# Patient Record
Sex: Female | Born: 1960 | Race: Black or African American | Hispanic: No | Smoking: Former smoker
Health system: Southern US, Community
[De-identification: ages and names within clinical notes are randomized; demographics above are authoritative.]

## PROBLEM LIST (undated history)

## (undated) DIAGNOSIS — K5090 Crohn's disease, unspecified, without complications: Secondary | ICD-10-CM

## (undated) DIAGNOSIS — I10 Essential (primary) hypertension: Secondary | ICD-10-CM

---

## 2004-12-10 ENCOUNTER — Ambulatory Visit (HOSPITAL_COMMUNITY): Admission: RE | Admit: 2004-12-10 | Discharge: 2004-12-10 | Payer: Self-pay | Admitting: Obstetrics and Gynecology

## 2005-07-31 ENCOUNTER — Emergency Department (HOSPITAL_COMMUNITY): Admission: EM | Admit: 2005-07-31 | Discharge: 2005-08-01 | Payer: Self-pay | Admitting: Emergency Medicine

## 2005-08-01 ENCOUNTER — Encounter (INDEPENDENT_AMBULATORY_CARE_PROVIDER_SITE_OTHER): Payer: Self-pay | Admitting: *Deleted

## 2006-04-01 ENCOUNTER — Ambulatory Visit: Payer: Self-pay | Admitting: Family Medicine

## 2006-04-06 ENCOUNTER — Ambulatory Visit: Payer: Self-pay | Admitting: Sports Medicine

## 2006-04-08 ENCOUNTER — Ambulatory Visit: Payer: Self-pay | Admitting: Family Medicine

## 2006-04-21 ENCOUNTER — Ambulatory Visit (HOSPITAL_COMMUNITY): Admission: RE | Admit: 2006-04-21 | Discharge: 2006-04-21 | Payer: Self-pay | Admitting: Obstetrics and Gynecology

## 2006-05-04 ENCOUNTER — Encounter: Admission: RE | Admit: 2006-05-04 | Discharge: 2006-05-04 | Payer: Self-pay | Admitting: Obstetrics and Gynecology

## 2007-05-16 IMAGING — MG MM DIAGNOSTIC LTD RIGHT
3 series · 3 of 3 positions shown · non-contrast
Comparison: none

[REDACTED] RIGHT
CC and MLO view(s) were taken of the right breast.

RIGHT BREAST ULTRASOUND
DIGITAL LIMITED RIGHT DIAGNOSTIC MAMMOGRAM AND RIGHT BREAST ULTRASOUND:
CLINICAL DATA: Abnormal screening mammogram.

[R CC]
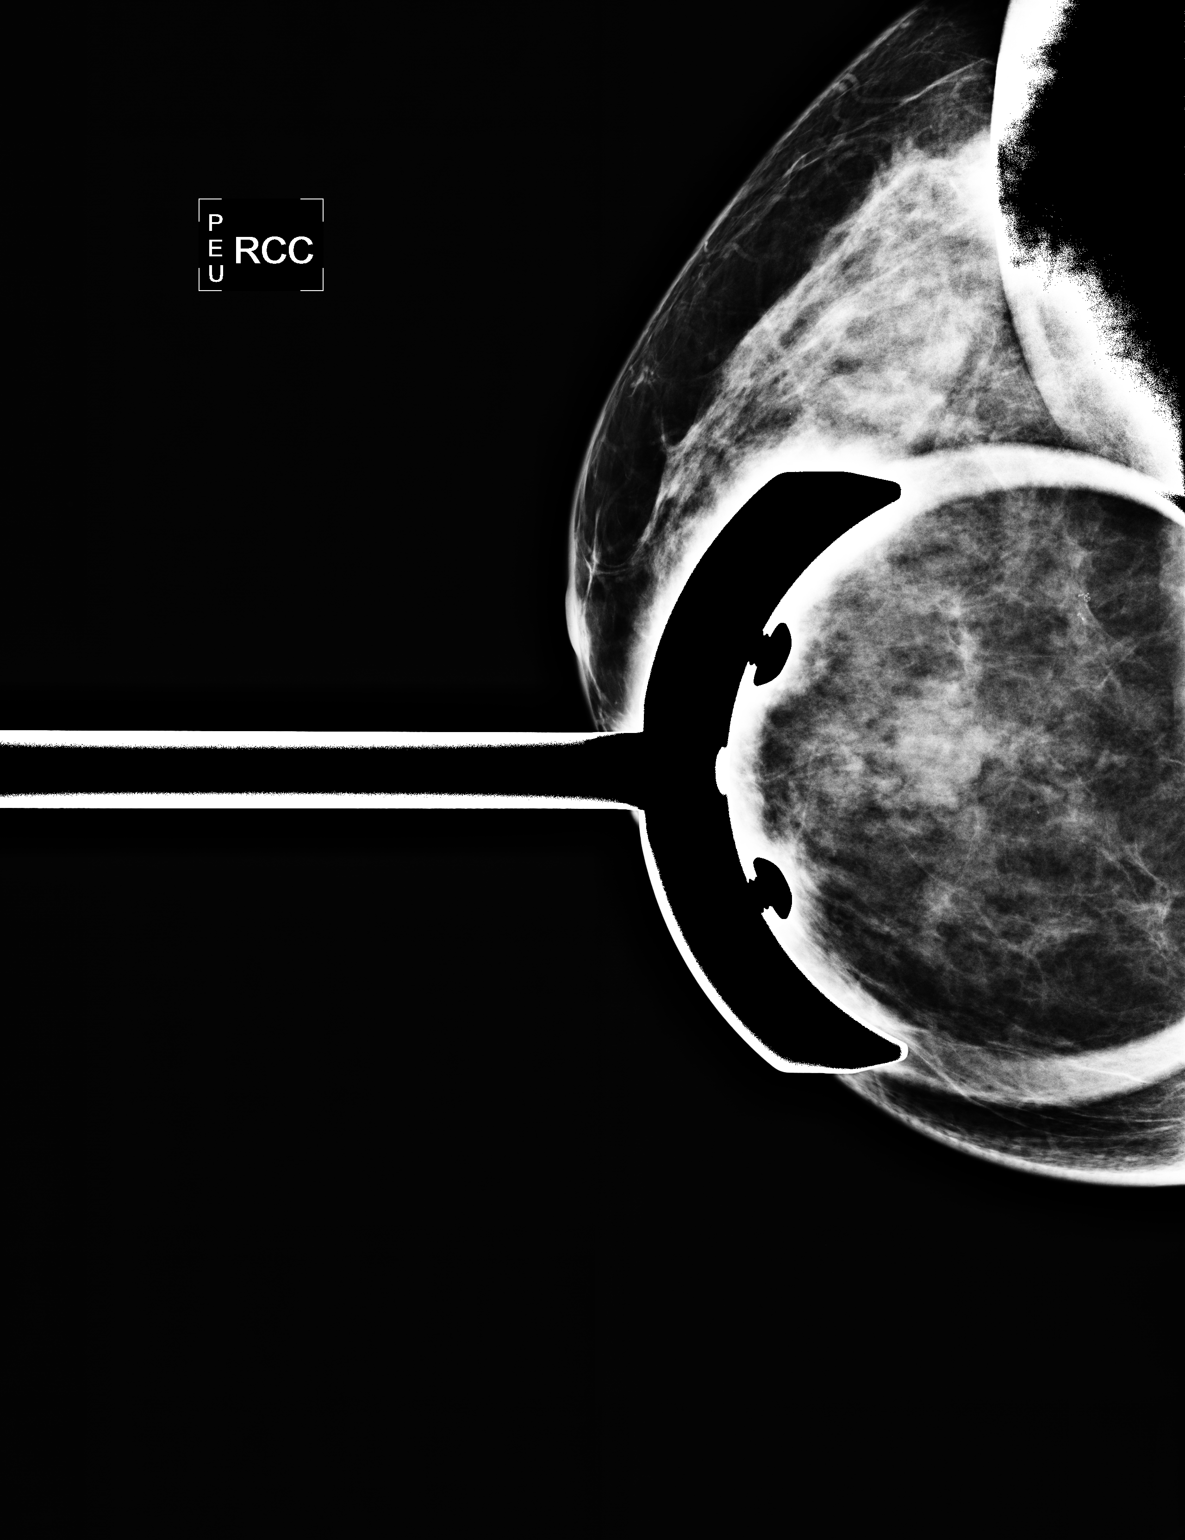

[R MLO (1 of 2)]
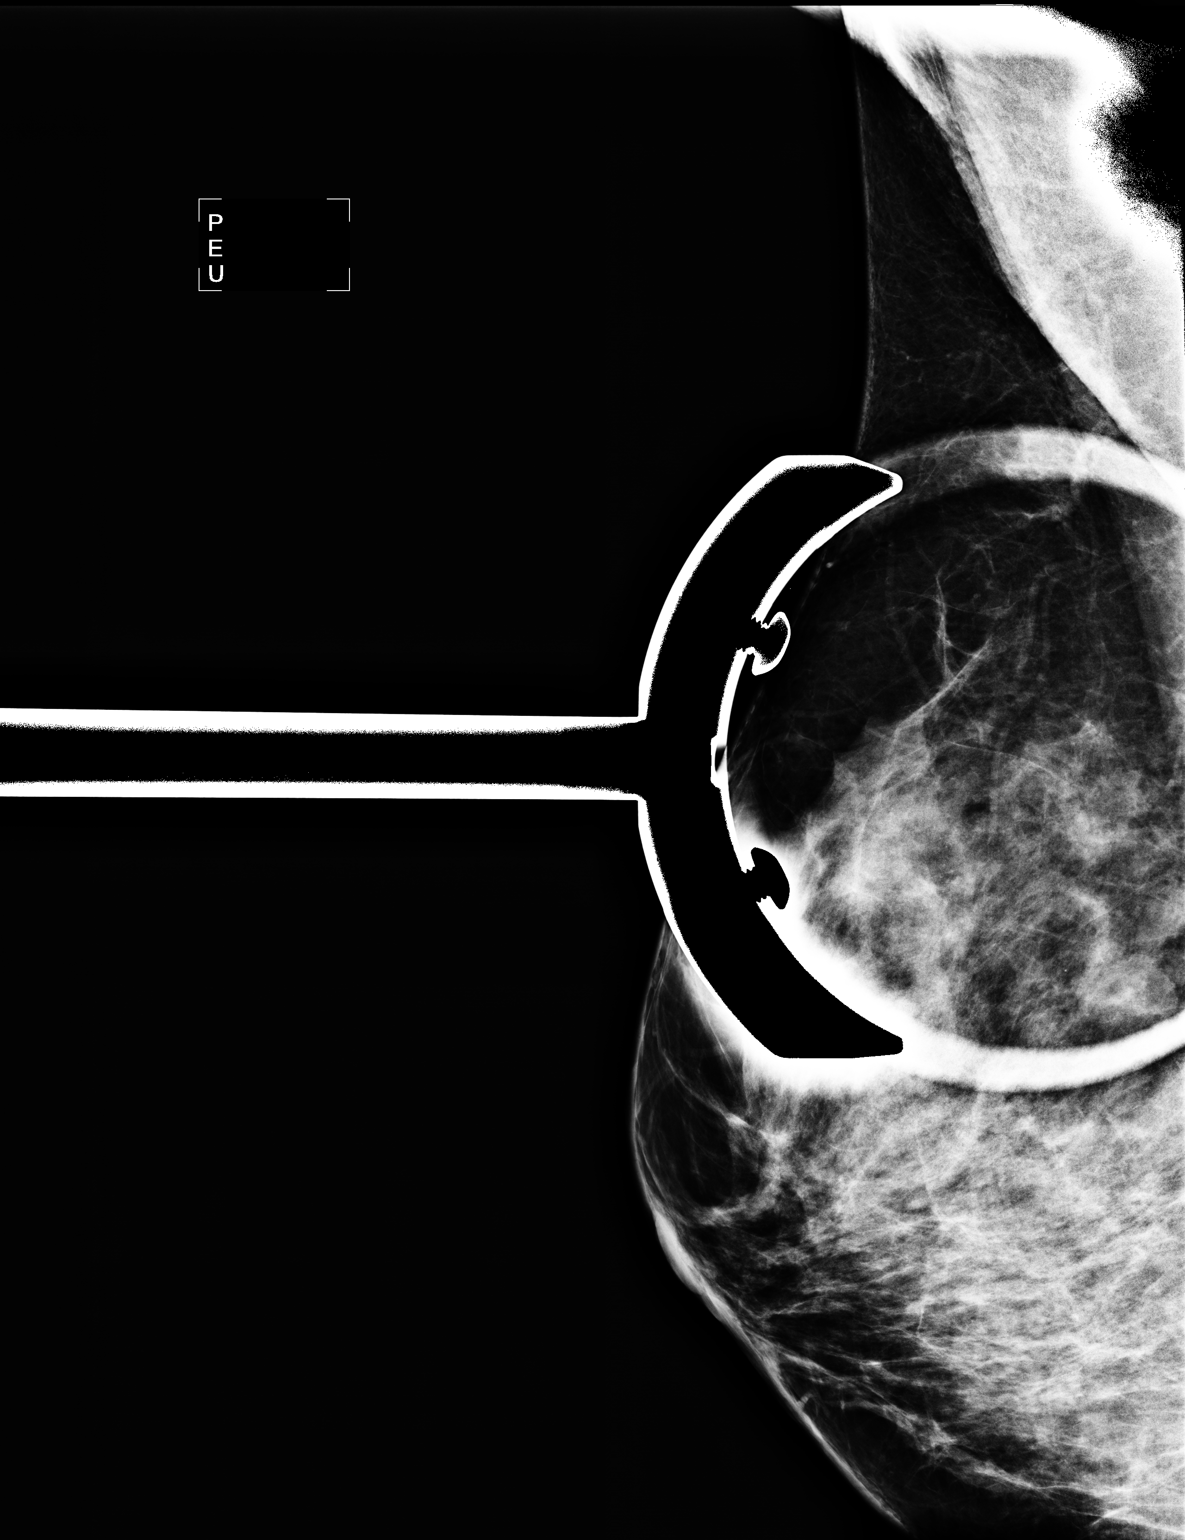

[R MLO (2 of 2)]
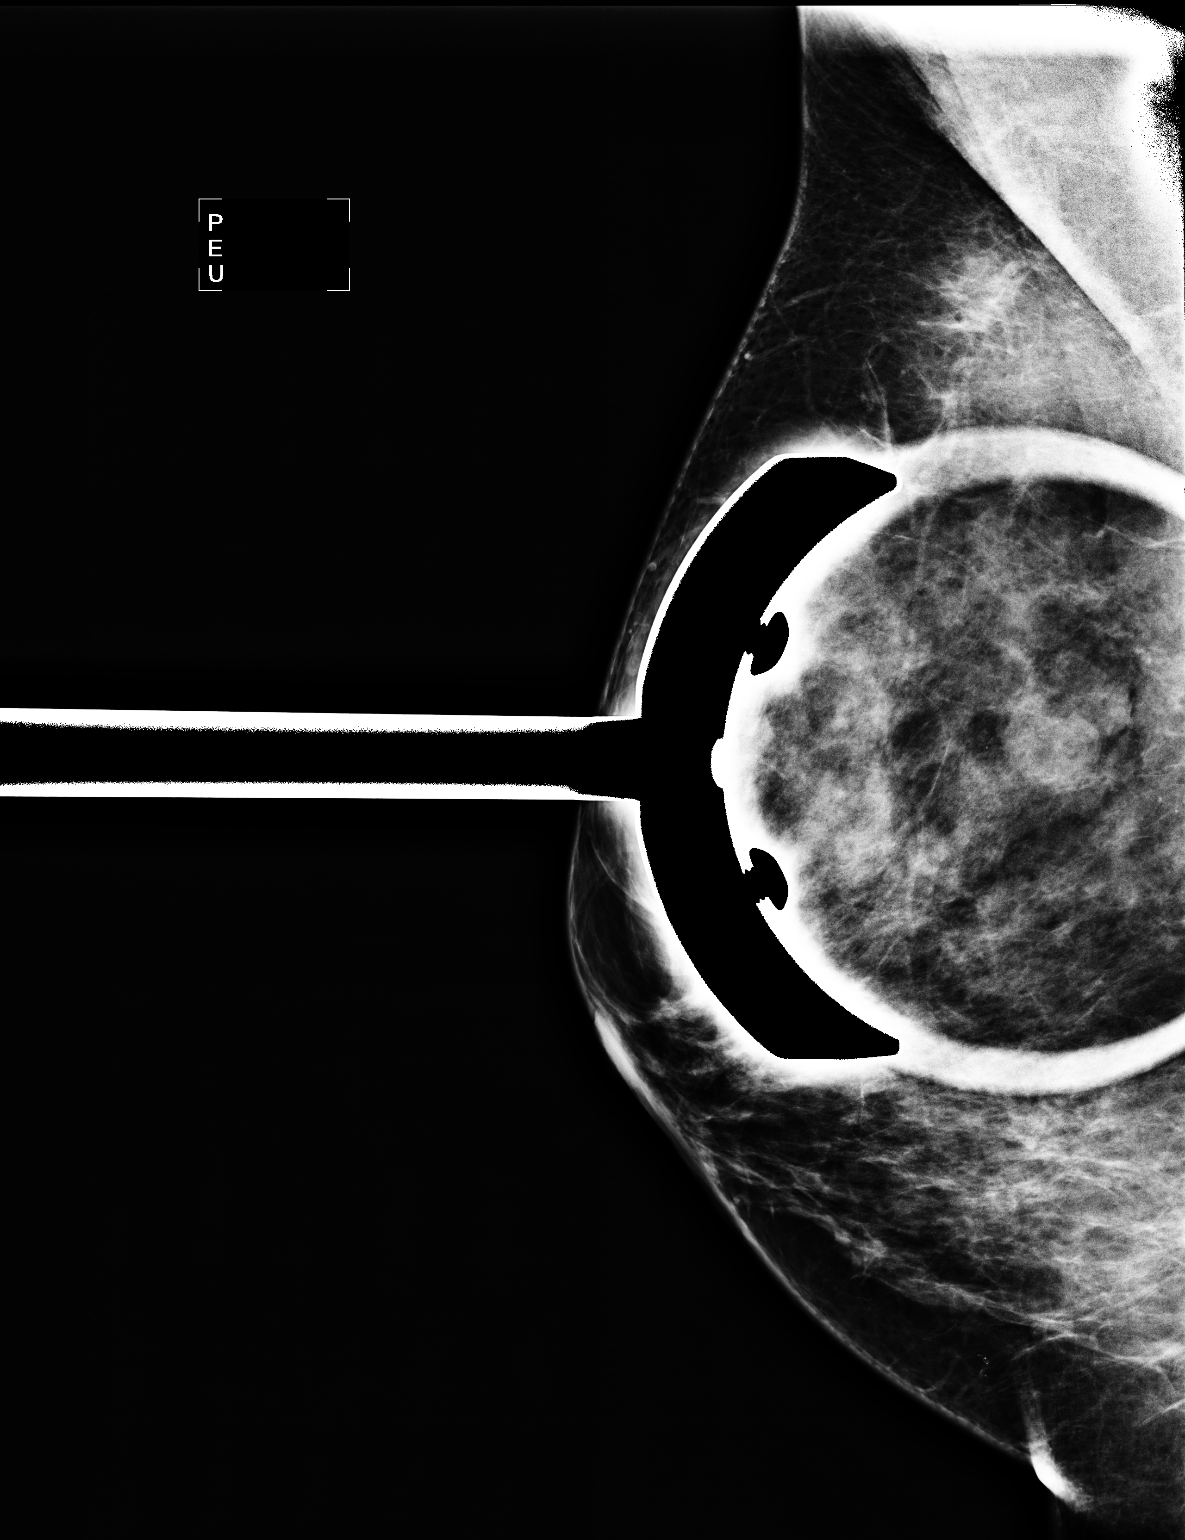

[3 of 3 positions shown; findings below may reference images not displayed]

Spot compression views reveal persistence of a well-defined nodule in the right upper inner 
quadrant.  No suspicious calcifications or distortion are present.

Physical exam of the right breast is normal.  Ultrasound reveals a simple cyst at the 1 o'clock 
position, 4 cm from the nipple which measures 1.2 cm.  No suspicious solid masses or shadowing are 
seen.
IMPRESSION: Simple cyst in the right upper inner quadrant.  There is no specific radiographic evidence of 
malignancy on the right.  Screening mammogram in one year is recommended.

ASSESSMENT: Benign - BI-RADS 2

Screening mammogram of both breasts in 1 year.
,

## 2007-06-06 ENCOUNTER — Encounter: Admission: RE | Admit: 2007-06-06 | Discharge: 2007-06-06 | Payer: Self-pay | Admitting: Obstetrics and Gynecology

## 2008-06-14 ENCOUNTER — Encounter: Admission: RE | Admit: 2008-06-14 | Discharge: 2008-06-14 | Payer: Self-pay | Admitting: Obstetrics and Gynecology

## 2008-06-26 ENCOUNTER — Encounter: Admission: RE | Admit: 2008-06-26 | Discharge: 2008-06-26 | Payer: Self-pay | Admitting: Obstetrics and Gynecology

## 2009-06-17 ENCOUNTER — Encounter: Admission: RE | Admit: 2009-06-17 | Discharge: 2009-06-17 | Payer: Self-pay | Admitting: Obstetrics and Gynecology

## 2009-06-25 ENCOUNTER — Emergency Department (HOSPITAL_COMMUNITY): Admission: EM | Admit: 2009-06-25 | Discharge: 2009-06-25 | Payer: Self-pay | Admitting: Family Medicine

## 2009-07-08 IMAGING — US UNKNOWN US STUDY
1 series · 9 of 9 positions shown · non-contrast
Comparison: Prior studies

CLINICAL DATA: Abnormal screening mammogram

DIGITAL DIAGNOSTIC  LEFT BREAST  MAMMOGRAM   AND LEFT BREAST
ULTRASOUND:

[Series 1: unknown us study · 9 of 9 slices shown]
[im 1/9]
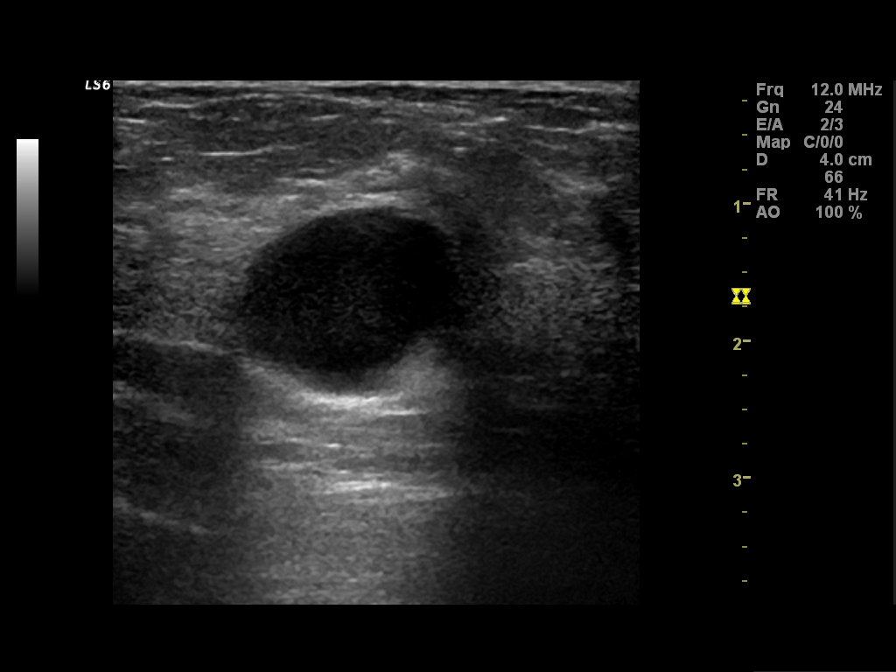
[im 2/9]
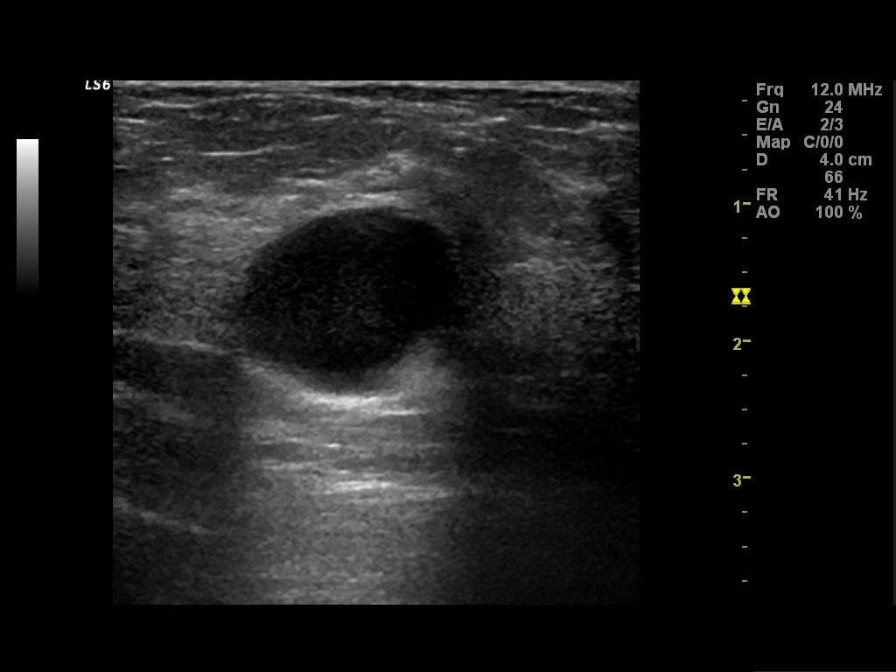
[im 3/9]
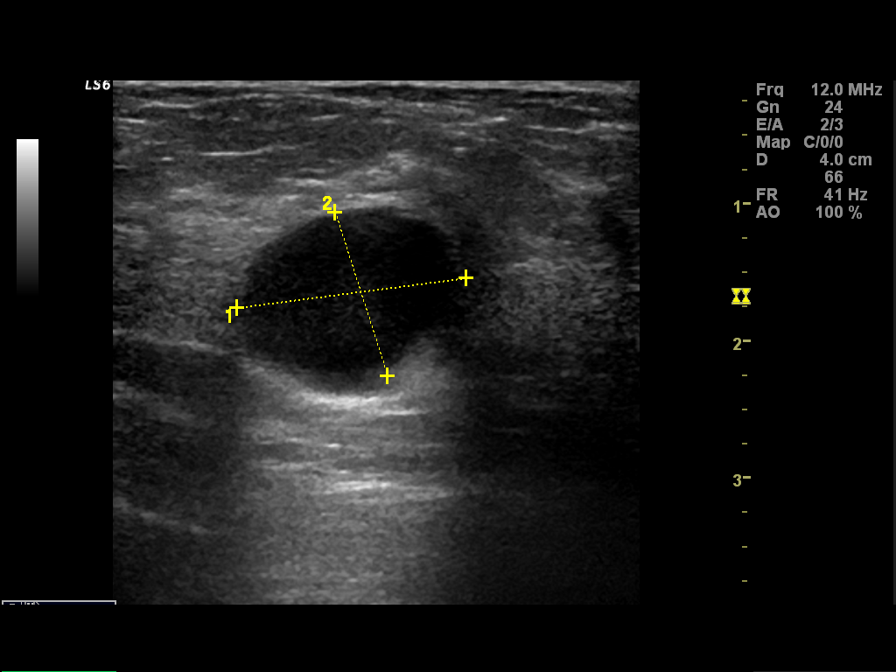
[im 4/9]
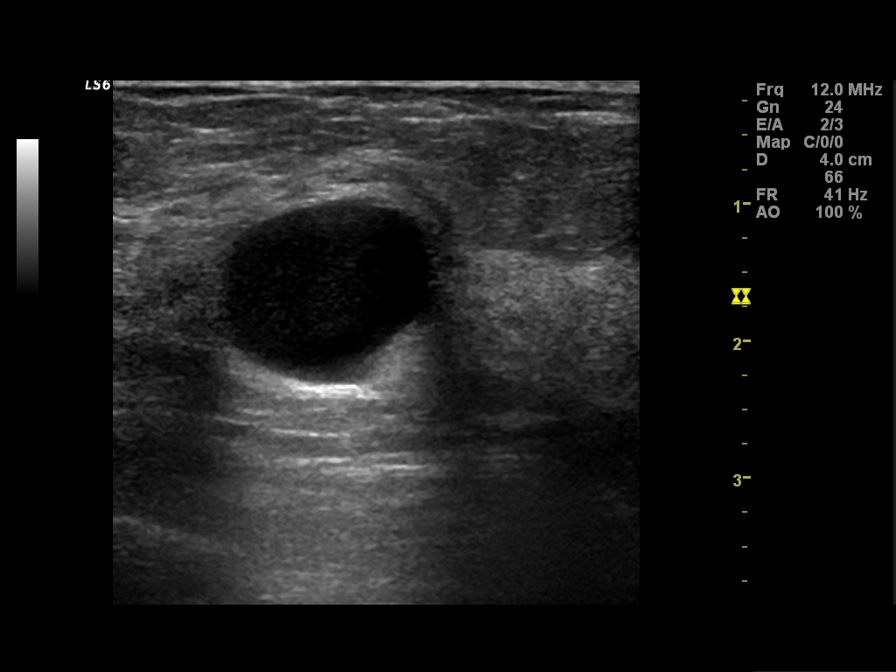
[im 5/9]
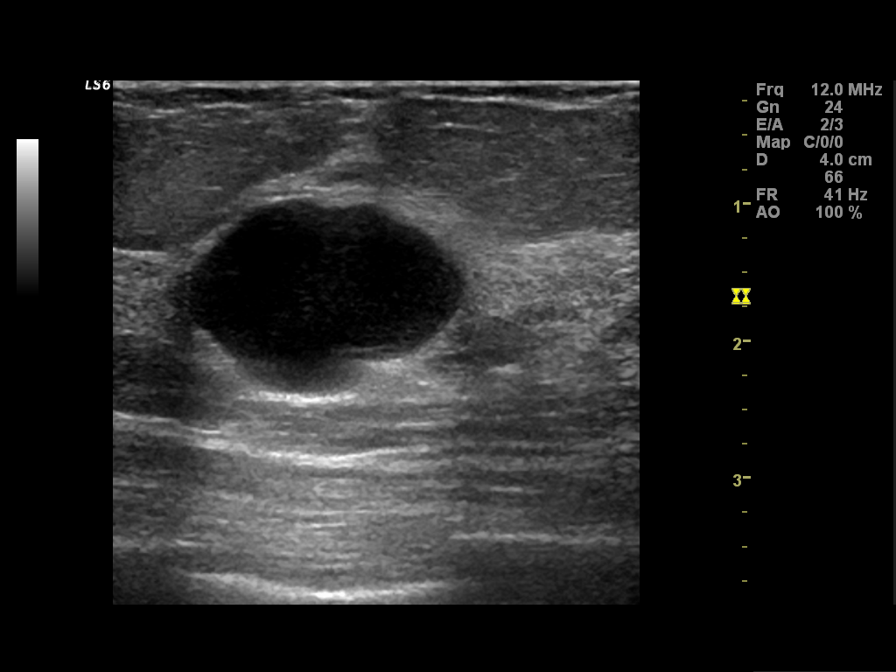
[im 6/9]
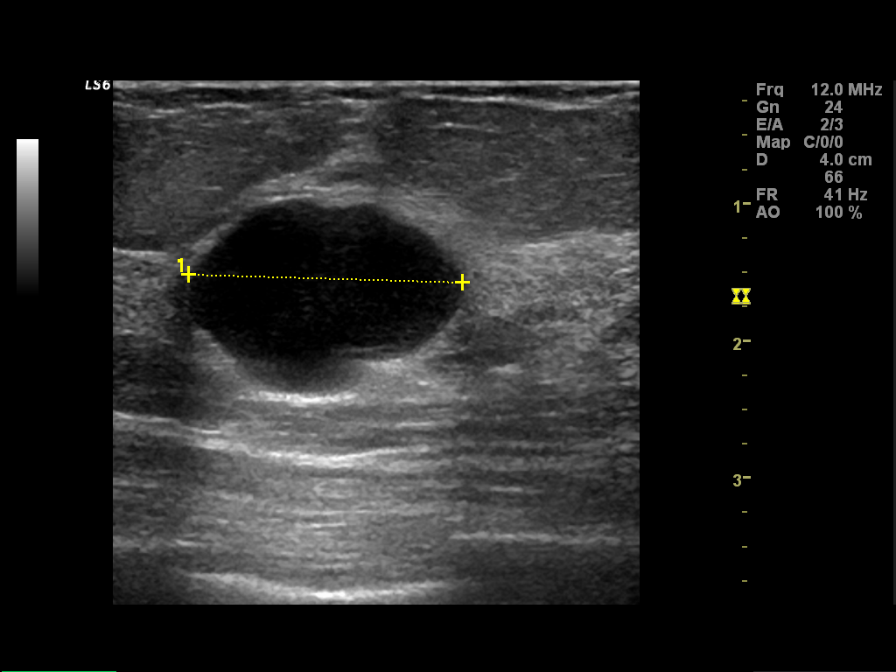
[im 7/9]
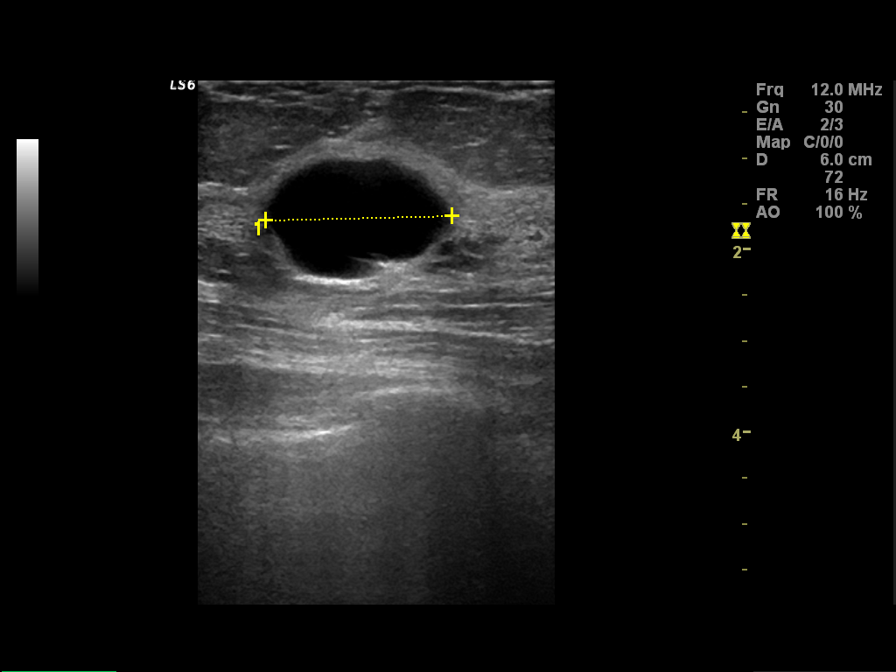
[im 8/9]
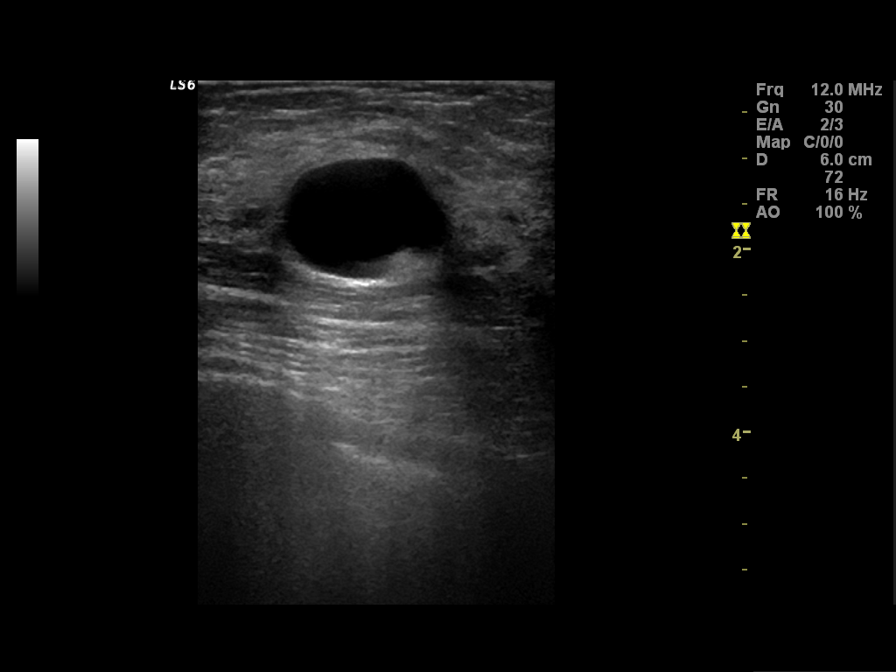
[im 9/9]
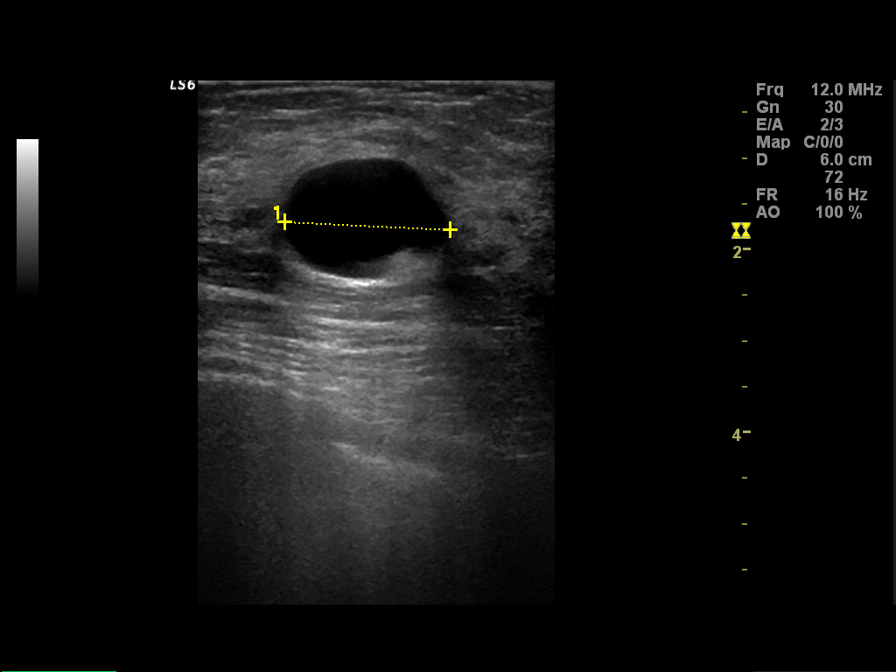

[9 of 9 positions shown; findings below may reference images not displayed]

FINDINGS: Focally compressed left CC and left MLO views were
obtained.  These demonstrate a partially circumscribed nodule
located at the 12 o'clock position within the left breast.

On physical exam, there is a small mobile palpable nodule located
superiorly within the left breast at the 12 o'clock position
(approximately 5 cm from the nipple).

Ultrasound is performed, showing a simple cyst located within the
left breast at the 12 o'clock position 5 cm from the nipple.  This
measures maximally 2 cm in diameter and corresponds to the nodular
density seen in this region on mammography.  There is no solid
mass, distortion, or worrisome shadowing.
IMPRESSION: 1.  2 cm in diameter simple cyst located at the 12 o'clock position
within the left breast 5 cm from the nipple corresponding to the
nodule seen in this region on mammography.  No findings worrisome
for malignancy.  Recommend return to screening mammography in 1
year.

BI-RADS CATEGORY 2:  Benign finding(s).

## 2010-04-15 ENCOUNTER — Inpatient Hospital Stay (HOSPITAL_COMMUNITY): Admission: AD | Admit: 2010-04-15 | Discharge: 2010-04-19 | Payer: Self-pay | Admitting: Internal Medicine

## 2010-04-15 ENCOUNTER — Encounter (INDEPENDENT_AMBULATORY_CARE_PROVIDER_SITE_OTHER): Payer: Self-pay | Admitting: *Deleted

## 2010-04-18 ENCOUNTER — Encounter (INDEPENDENT_AMBULATORY_CARE_PROVIDER_SITE_OTHER): Payer: Self-pay | Admitting: *Deleted

## 2010-04-19 ENCOUNTER — Encounter (INDEPENDENT_AMBULATORY_CARE_PROVIDER_SITE_OTHER): Payer: Self-pay | Admitting: *Deleted

## 2010-04-22 ENCOUNTER — Telehealth: Payer: Self-pay | Admitting: Internal Medicine

## 2010-04-24 DIAGNOSIS — Z862 Personal history of diseases of the blood and blood-forming organs and certain disorders involving the immune mechanism: Secondary | ICD-10-CM

## 2010-04-24 DIAGNOSIS — Z8639 Personal history of other endocrine, nutritional and metabolic disease: Secondary | ICD-10-CM

## 2010-04-24 DIAGNOSIS — K5289 Other specified noninfective gastroenteritis and colitis: Secondary | ICD-10-CM

## 2010-04-24 DIAGNOSIS — E669 Obesity, unspecified: Secondary | ICD-10-CM

## 2010-04-24 DIAGNOSIS — I10 Essential (primary) hypertension: Secondary | ICD-10-CM | POA: Insufficient documentation

## 2010-04-24 DIAGNOSIS — A048 Other specified bacterial intestinal infections: Secondary | ICD-10-CM | POA: Insufficient documentation

## 2010-04-24 DIAGNOSIS — E8881 Metabolic syndrome: Secondary | ICD-10-CM

## 2010-04-24 DIAGNOSIS — E119 Type 2 diabetes mellitus without complications: Secondary | ICD-10-CM

## 2010-04-25 ENCOUNTER — Ambulatory Visit: Payer: Self-pay | Admitting: Internal Medicine

## 2010-05-05 ENCOUNTER — Telehealth: Payer: Self-pay | Admitting: Internal Medicine

## 2010-05-09 ENCOUNTER — Emergency Department (HOSPITAL_COMMUNITY): Admission: EM | Admit: 2010-05-09 | Discharge: 2010-05-09 | Payer: Self-pay | Admitting: Family Medicine

## 2010-05-13 ENCOUNTER — Encounter (INDEPENDENT_AMBULATORY_CARE_PROVIDER_SITE_OTHER): Payer: Self-pay | Admitting: *Deleted

## 2010-05-15 ENCOUNTER — Telehealth: Payer: Self-pay | Admitting: Internal Medicine

## 2010-05-15 ENCOUNTER — Ambulatory Visit: Payer: Self-pay | Admitting: Internal Medicine

## 2010-05-23 ENCOUNTER — Ambulatory Visit: Payer: Self-pay | Admitting: Internal Medicine

## 2010-05-26 ENCOUNTER — Encounter: Payer: Self-pay | Admitting: Internal Medicine

## 2010-05-27 ENCOUNTER — Telehealth (INDEPENDENT_AMBULATORY_CARE_PROVIDER_SITE_OTHER): Payer: Self-pay | Admitting: *Deleted

## 2010-08-23 ENCOUNTER — Encounter: Payer: Self-pay | Admitting: Obstetrics and Gynecology

## 2010-08-24 ENCOUNTER — Encounter: Payer: Self-pay | Admitting: Obstetrics and Gynecology

## 2010-09-02 NOTE — Procedures (Signed)
Summary: Upper Endoscopy  Patient: Barbara Drake Note: All result statuses are Final unless otherwise noted.  Tests: (1) Upper Endoscopy (EGD)   EGD Upper Endoscopy       DONE     [facility]     [address]     [place], [place]  [address]           ENDOSCOPY PROCEDURE REPORT           PATIENT:  Barbara Drake, Barbara Drake  MR#:  [id number]     BIRTHDATE:  1960-10-24, 49 yrs. old  GENDER:  female           ENDOSCOPIST:  Hedwig Morton. Juanda Chance, MD     Referred by:  Della Goo, M.D.           PROCEDURE DATE:  05/23/2010     PROCEDURE:  EGD with biopsy     ASA CLASS:  Class II     INDICATIONS:  abdominal pain, multiple sites, diarrhea, abnormal     imaging, anemia CT showed colitis,     positive H.Pylori antibody treated, feels improved           MEDICATIONS:   Versed 5 mg, Fentanyl 50 mcg     TOPICAL ANESTHETIC:  Exactacain Spray           DESCRIPTION OF PROCEDURE:   After the risks benefits and     alternatives of the procedure were thoroughly explained, informed     consent was obtained.  The LB GIF-H180 [id number] endoscope was     introduced through the mouth and advanced to the second portion of     the duodenum, without limitations.  The instrument was slowly     withdrawn as the mucosa was fully examined.     <<PROCEDUREIMAGES>>           The upper, middle, and distal third of the esophagus were     carefully inspected and no abnormalities were noted. The z-line     was well seen at the GEJ. The endoscope was pushed into the fundus     which was normal including a retroflexed view. The antrum,gastric     body, first and second part of the duodenum were unremarkable.     With standard forceps, a biopsy was obtained and sent to pathology.     gastric biopsy to r/o H (see image1, image2, image3, image4,     image5, and image6).Pylori    Retroflexed views revealed no     abnormalities.    The scope was then withdrawn from the patient     and the procedure completed.          COMPLICATIONS:  None           ENDOSCOPIC IMPRESSION:     1) Normal EGD     s/p gastric biopsies for H.Pylori     RECOMMENDATIONS:     1) Await biopsy results     colonoscopy           REPEAT EXAM:  In 0 year(s) for.           ______________________________     Hedwig Morton. Juanda Chance, MD           CC:           n.     eSIGNED:   Hedwig Morton. Brodie at 05/23/2010 08:44 AM           [name], [id number]  Note: An exclamation  mark (!) indicates a result that was not dispersed into the flowsheet. Document Creation Date: 05/23/2010 8:44 AM _______________________________________________________________________  (1) Order result status: Final Collection or observation date-time: 05/23/2010 08:15 Requested date-time:  Receipt date-time:  Reported date-time:  Referring Physician:   Ordering Physician: Lina Sar [phone]) Specimen Source:  Source: Launa [employer] Order Number: [id number] Lab site:

## 2010-09-02 NOTE — Procedures (Signed)
Summary: Colonoscopy  Patient: [name] Note: All result statuses are Final unless otherwise noted.  Tests: (1) Colonoscopy (COL)   COL Colonoscopy           DONE     [facility]     [address]     [place], [place]  [address]           COLONOSCOPY PROCEDURE REPORT           PATIENT:  Barbara Drake, Barbara Drake  MR#:  [id number]     BIRTHDATE:  1961/01/20, 49 yrs. old  GENDER:  female     ENDOSCOPIST:  Hedwig Morton. Juanda Chance, MD     REF. BY:  Della Goo, M.D.     PROCEDURE DATE:  05/23/2010     PROCEDURE:  Colonoscopy with biopsy     ASA CLASS:  Class II     INDICATIONS:  unexplained diarrhea, hematochezia, Iron deficiency     anemia, Abdominal pain, Abnormal CT of abdomen recent hospital for     dedydration due to severe diarrhea     MEDICATIONS:   Versed 2 mg           DESCRIPTION OF PROCEDURE:   After the risks benefits and     alternatives of the procedure were thoroughly explained, informed     consent was obtained.  Digital rectal exam was performed and     revealed no rectal masses.   The LB PCF-H180AL [id number] endoscope     was introduced through the anus and advanced to the cecum, which     was identified by both the appendix and ileocecal valve, without     limitations.  The quality of the prep was good, using MiraLax.     The instrument was then slowly withdrawn as the colon was fully     examined.     <<PROCEDUREIMAGES>>           FINDINGS:  There were mucosal changes consistent with Crohn's     disease. widespread small and large shallow ulcerations through     the colon with rectosigmoid sparing, consistent with moderately     severe Crohn's disease, ileocecal valve appears normal, unable to     intubate TI Random biopsies were obtained and sent to pathology     (see image1, image2, image3, image4, image5, image6, image7,     image8, image9, image10, image11, and image12). #1 right colon, #2     left colon   Retroflexed views in the rectum revealed  no     abnormalities.    The scope was then withdrawn from the patient     and the procedure completed.           COMPLICATIONS:  None     ENDOSCOPIC IMPRESSION:     1) Colitis - Crohn's     moderately severe, s/p random biopsies,,relative rectosigmoid     sparing, TI not intubated     RECOMMENDATIONS:     1) Await biopsy results     Asacal HD 800mg  2 po tid     OV 3-4 weeks     Bentyl 10 mg po bid     Feso4 325 mg po tid     REPEAT EXAM:  In 5 year(s) for.           ______________________________     Hedwig Morton. Juanda Chance, MD           CC:  n.     eSIGNED:   Hedwig Morton. Brodie at 05/23/2010 08:56 AM           Page 2 of 3   Talia, Hoheisel Dixon, [id number]  Note: An exclamation mark (!) indicates a result that was not dispersed into the flowsheet. Document Creation Date: 05/23/2010 8:56 AM _______________________________________________________________________  (1) Order result status: Final Collection or observation date-time: 05/23/2010 08:35 Requested date-time:  Receipt date-time:  Reported date-time:  Referring Physician:   Ordering Physician: Lina Sar [phone]) Specimen Source:  Source: Launa Grill Order Number: [id number] Lab site:   Appended Document: Colonoscopy     Procedures Next Due Date:    Colonoscopy: 06/2015

## 2010-09-02 NOTE — Discharge Summary (Signed)
Summary: Colitis   NAME:  Barbara Drake, Barbara Drake           ACCOUNT NO.:  [id number]      MEDICAL RECORD NO.:  [id number]          PATIENT TYPE:  INP      LOCATION:  1318                         FACILITY:  [facility]      PHYSICIAN:  Pleas Koch, MD        DATE OF BIRTH:  1960-09-19      DATE OF ADMISSION:  04/15/2010   DATE OF DISCHARGE:                                  DISCHARGE SUMMARY      THIS DOCUMENT HAS NOT BEEN REVIEWED OR ELECTRONICALLY SIGNED BY THE   DICTATOR OR ATTENDING PHYSICIAN      PRIMARY CARE PHYSICIAN:  Della Goo, M.D.      DISCHARGE DIAGNOSES:   1. Unspecified colitis.   2. Hypokalemia.   3. Relative hypotension.   4. Moderately controlled diabetes mellitus type 2.   5. Obesity.   6. Metabolic syndrome.      DISCHARGE MEDICATIONS:  Promethazine 25 mg p.o. q.8 hourly as needed and   20 day supply was given.      BRIEF HOSPITAL HISTORY:  The patient is a 50 year old female seen at Dr.   Ernie Avena office secondary to complaint of nausea, vomiting, diarrhea,   chills, fever, crampy abdominal pain worsening over 5 days.  The patient   reported inability to hold down food and liquid.  The patient states   diffuse abdominal pain 8-9/10.  Denied hematemesis, hematochezia,   melena.  Found to have temperature of 101.5.  Found to also have   tachycardia 107.  The patient does work in a nursing home and the   diagnosis of C. diff colitis was entertained.      PAST MEDICAL HISTORY:  Significant for hypertension.      PAST SURGICAL HISTORY:  Left oophorectomy.      ADMISSION MEDICATIONS:  Amlodipine 2.5 mg once daily.      SOCIAL HISTORY:  The patient is a former smoker, nondrinker, no illicit   drugs.      PHYSICAL EXAMINATION:  VITAL SIGNS:  On exam at the office the patient   was found to have temperature 101.5, blood pressure 112/78, heart rate   from 91-107, respirations 18.   HEENT:  PERRLA, EOMI.  Funduscopy benign.  Nares patent.  Oropharynx   clear.   NECK:  Supple, full range of motion, no thyromegaly.   CARDIOVASCULAR:  Mild tachycardia.  No murmurs, rubs or gallops.   LUNGS:  Clinically clear.   ABDOMEN:  Positive bowel sounds, soft, mildly tender diffusely.  No   rebound, guarding.  No hepatosplenomegaly.   EXTREMITIES:  No cyanosis or clubbing.   NEUROLOGICAL:  Grossly normal.      The patient had blood cultures ordered, stool cultures, C. diff PCR, C.   diff was done and was directly admitted.      HOSPITAL SUMMARY:  According to chart.  The patient was seen on   September 14 and stated that she felt better.  She had CT abdomen that   showed...labs on admission were as follows...pertinent admission labs   were  as follows:  WBC 8, hemoglobin 11.4, hematocrit 34.0, platelets   282, neutrophils 72, lymphocytes 16, eosinophils 1, basophils 0.  CMP   was sodium 137, potassium 3.6, chloride 105, CO2 24, glucose 95, BUN 6,   creatinine 0.82, T bili 0.2, alk phos 58, SGOT 23, SGPT 27, total   protein 7.1, total albumin 3.4, calcium 8.7.  TSH was 1.765, fecal   lactoferrin was positive.      HOSPITAL COURSE:   1. Colitis / nausea, vomiting / gastroenteritis.  Interestingly the       patient did not really show any signs of acute inflammation on CBC.       Her white count was totally normal all throughout her hospital       stay.  Her C. diff toxin was done which was negative as well.  C.       diff PCR which was negative.  Blood cultures were both negative       from September 13 as well as urine culture which was negative as       well.  She did have a positive Helicobacter pylori and I did       consult Dr. Orvan Falconer of infectious disease to determine course of       care.  He recommended no further antibiotics.  The antibiotics the       patient was on are as follows:  The patient was initially placed on       ciprofloxacin on September 15 and received only one dose of that.       The patient was on metronidazole from September 15  and received 3       days of this.  I did place the patient on Pepto-Bismol       prophylactically as well as various medications such as Gas-X and       Bentyl to decompress abdomen as the patient states she had a lot of       gas.  The patient was also placed on Zofran as well as Phenergan       and did complain of occasional bouts of nausea and vomiting as well       as diarrhea.  Interestingly none of these diarrheal stools were       charted or noticed or documented by nursing although the patient       stated the same.  It is unclear at this point as to whether this       was an objective or subjective diarrhea.  She was discontinued off       all antibiotics on day of discharge April 19, 2010, as she was       afebrile.  She did have a temperature occasionally on September 14       night and September 13, however, was afebrile on September 16 and       September 17.  Has discharged home after GI had seen and stated       that they would follow her up as well.   2. Diabetes mellitus.  Diabetes mellitus was moderately controlled       while in hospital.  Her sugars ranged from low 100s to a high of       160 on discharge when she was eating a complete meal.  This will       need to be followed as an outpatient.  The patient may benefit from  low-dose metformin for metabolic syndrome.   3. Relative hypotension.  The patient was only on 2.5 of Norvasc and I       have held this during this hospitalization.  We will let PCP       determine if there is a further need for this.   4. Obesity.  The patient will need secondary prevention measures   5. Hypokalemia.  The patient was hypokalemic on one episode during       hospitalization on September 15 and given oral potassium       supplementation which resolved it.      DISCHARGE EXAMINATION:  Were as follows:   VITAL SIGNS:  97.9, pulse 79, respirations 18, blood pressure 98/63, 96%   on room air.  CBGs were 160 and 106.    GENERAL:  The patient was alert, happy.   HEENT:  Mucosa moist.   CHEST:  Chest was clear.   HEART:  S1, S2, normal murmurs, rubs or gallops.   ABDOMEN:  Soft, nontender, nondistended, no mass.  Bowel sounds present.   NEUROLOGICAL:  Cranial nerves II-XII were intact.      DISCHARGE LABS:  Sodium 137, potassium 3.9, chloride 106, bicarb 25, BUN   4, creatinine 0.72, hemoglobin 9.6, hematocrit 28.5, WBC 7.2, platelets   282, Helicobacter pylori IgG positive at 1.2.      FOLLOWUP:  The patient is to follow up with Dr. Ewing Schlein and Dr. Lovell Sheehan as   per my dictation; 3-5 days with Dr. Lovell Sheehan and 1-2 weeks with Dr.   Ewing Schlein, to reassess the need for possible colonoscopy.      PERTINENT RADIOLOGICAL FINDINGS:  The patient had a CT abdomen on   September 13 which showed mucosal edema of the right colon, transverse   colon to the mid descending colon consistent with colitis.  Appears to   be mucosal edema of the terminal ileum suggesting positive inflammatory   bowel disease such as Crohn's disease, no abscess seen, slightly   prominent common bile duct correlate with LFTs.                  ______________________________   Pleas Koch, MD            THIS DOCUMENT HAS NOT BEEN REVIEWED OR ELECTRONICALLY SIGNED BY THE   DICTATOR OR ATTENDING PHYSICIAN      JS/MEDQ  D:  04/19/2010  T:  04/19/2010  Job:  [id number]      cc:   Petra Kuba, M.D.   Fax: [phone]      Della Goo, M.D.   Fax: [phone]

## 2010-09-02 NOTE — Assessment & Plan Note (Signed)
Summary: post hospital colitis/dn   History of Present Illness Visit Type: consult  Primary GI MD: Lina Sar MD Primary Provider: Ron Parker, MD  Requesting Provider: Ron Parker, MD  Chief Complaint: [facility] f/u for colitis. Pt states that she is on her way to feeling better but still having loose stools  History of Present Illness:   This is a 50 year old African American female who recently hospitalized with acute colitis which started with nausea vomiting, diarrhea, abdominal pain and fever. Her symptoms started several weeks after she begun  Atkins diet, which consists of low carbohydrate meals. Up to that point, she was having regular bowel movements once or twice a day. She started having abdominal distention and malodorous stools at frequent intervals through the day and also through the night. She was having accidents. There was no blood in her stool. She was hospitalized between September 13-17.with severe diarrhea, N&V, dehydration.  Her  stool studies were negative except for fecal lactoferrin which was positive. She had mild anemia with a hemoglobin of 11.4 and a hematocrit of 34. Her albumin was 3.4. A CT scan showed diffuse edema of the terminal ileum, right colon and transverse colon with a normal descending colon. She had a prominent common bile duct. A C. difficile toxin by PCR was negative. She was seen in consultation by Dr Ewing Schlein and chose to have an outpatient colonoscopy  instead of an inpatient colonoscopy. There is no family history of inflammatory bowel disease. She was started on Prevpak  by Dr Lovell Sheehan in the setting of positive H.Pylori antibody.. She has noticed marked improvement of her symptoms on Amoxacillin and Bioxin.  Her stools are softer and she has less pain and less abdominal distention. There is no longer   fever.   GI Review of Systems      Denies abdominal pain, acid reflux, belching, bloating, chest pain, dysphagia with liquids, dysphagia with  solids, heartburn, loss of appetite, nausea, vomiting, vomiting blood, weight loss, and  weight gain.        Denies anal fissure, black tarry stools, change in bowel habit, constipation, diarrhea, diverticulosis, fecal incontinence, heme positive stool, hemorrhoids, irritable bowel syndrome, jaundice, light color stool, liver problems, rectal bleeding, and  rectal pain.    Current Medications (verified): 1)  Prevpac  Misc (Amoxicill-Clarithro-Lansopraz) .... Four in The Morning and Four At Bedtime X 14 Days  Allergies (verified): No Known Drug Allergies  Past History:  Past Medical History: Reviewed history from 04/24/2010 and no changes required. 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 Left oophorectomy  Family History: Reviewed history from 04/24/2010 and no changes required. No FH of Colon Cancer:  Social History: Reviewed history from 04/24/2010 and no changes required. Patient is a former smoker.  Alcohol Use - no Illicit Drug Use - no  Review of Systems       The patient complains of fatigue.  The patient denies allergy/sinus, anemia, anxiety-new, arthritis/joint pain, back pain, blood in urine, breast changes/lumps, change in vision, confusion, cough, coughing up blood, depression-new, fainting, fever, headaches-new, hearing problems, heart murmur, heart rhythm changes, itching, menstrual pain, muscle pains/cramps, night sweats, nosebleeds, pregnancy symptoms, shortness of breath, skin rash, sleeping problems, sore throat, swelling of feet/legs, swollen lymph glands, thirst - excessive , urination - excessive , urination  changes/pain, urine leakage, vision changes, and voice change.          Pertinent positive and negative review of systems were noted in the above HPI. All other ROS was otherwise negative.   Vital Signs:  Patient profile:   50 year old female Height:      62 inches Weight:      158 pounds BMI:     29.00 BSA:     1.73 Pulse rate:   72 / minute Pulse rhythm:   regular BP sitting:   124 / 68  (left arm) Cuff size:   regular  Vitals Entered By: Ok Anis CMA (April 25, 2010 11:04 AM)  Physical Exam  General:  Well developed, well nourished, no acute distress. Eyes:  PERRLA, no icterus. Mouth:  No deformity or lesions, dentition normal. Neck:  Supple; no masses or thyromegaly. Lungs:  Clear throughout to auscultation. Heart:  Regular rate and rhythm; no murmurs, rubs,  or bruits. Abdomen:  mildly protuberant and very tender throughout mostly the right lower quadrant, right upper quadrant and across the upper abdomen. The left lower middle and upper quadrants are unremarkable. There is no rebound. There is no palpable mass or fluid wave.. Rectal:  soft Hemoccult-negative stool.. Extremities:  No clubbing, cyanosis, edema or deformities noted. Skin:  Intact without significant lesions or rashes. Psych:  Alert and cooperative. Normal mood and affect.   Impression & Recommendations:  Problem # 1:  COLITIS (ICD-558.9) Patient has acute colitis which is gradually getting better, unknown etiology. The rapid onset suggest an infectious or toxic etiology. All stool studies have been negative. She is slowly improving. Ischemic colitis would be less likely. Crohn's disease and ulcerative colitis are considerations although I feel that we are dealing with an acute self-limited colitis. She is still very tender and it could be very traumatic to her colon  to proceed with a colonoscopy at this point. She is doing better and the colonoscopy prep could set her back. I prefer for her to continue to take Prevpak since it contains broad-spectrum antibiotics which cover  enteric bacteria. I have explained to her a low fiber diet. She will be on a part-time work schedule this week and will call me next Friday to update her status. We will try to do a colonoscopy when her abdominal tenderness has decreased and when the risk of perforation would be significantly reduced.  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  Patient Instructions: 1)  continue Prevpac. 2)  Low residue diet explained, information booklet given. 3)  Call us in one week with status update. 4)  Plan colonoscopy in the next several weeks after abdominal tenderness has decreased at which time the prep would be more feasible. 5)  Copy sent to : Dr H.Jenkins 6)  The medication list was reviewed and reconciled.  All changed / newly prescribed medications were explained.  A complete medication list was provided to the patient / caregiver.

## 2010-09-02 NOTE — Miscellaneous (Signed)
Summary: [facility] Previsit/prep  Clinical Lists Changes  Medications: Added new medication of DULCOLAX 5 MG  TBEC (BISACODYL) Day before procedure take 2 at 3pm and 2 at 8pm. - Signed Added new medication of METOCLOPRAMIDE HCL 10 MG  TABS (METOCLOPRAMIDE HCL) As per prep instructions. - Signed Added new medication of MIRALAX   POWD (POLYETHYLENE GLYCOL 3350) As per prep  instructions. - Signed Rx of DULCOLAX 5 MG  TBEC (BISACODYL) Day before procedure take 2 at 3pm and 2 at 8pm.;  #4 x 0;  Signed;  Entered by: Wyona Almas RN;  Authorized by: Hart Carwin MD;  Method used: Electronically to [facility]  [phone]*, [address] [place], [place], [place]  [address], Ph: [phone] or [phone], Fax: [phone] Rx of METOCLOPRAMIDE HCL 10 MG  TABS (METOCLOPRAMIDE HCL) As per prep instructions.;  #2 x 0;  Signed;  Entered by: Wyona Almas RN;  Authorized by: Hart Carwin MD;  Method used: Electronically to [facility]  [phone]*, [address] [place], [place], [place]  [address], Ph: [phone] or [phone], Fax: [phone] Rx of MIRALAX   POWD (POLYETHYLENE GLYCOL 3350) As per prep  instructions.;  #255gm x 0;  Signed;  Entered by: Wyona Almas RN;  Authorized by: Hart Carwin MD;  Method used: Electronically to [facility]  [phone]*, [address] [place], [place], [place]  [address], Ph: [phone] or [phone], Fax: [phone] Allergies: Added new allergy or adverse reaction of SULFA Observations: Added new observation of NKA: F (05/15/2010 11:09)    Prescriptions: MIRALAX   POWD (POLYETHYLENE GLYCOL 3350) As per prep  instructions.  #255gm x 0   Entered by:   Wyona Almas RN   Authorized by:   Hart Carwin MD   Signed by:   Wyona Almas RN on 05/15/2010   Method used:   Electronically to        [facility]  [phone]* (retail)       [address]       [place], [place]  [address]       Ph: [phone] or [phone]       Fax: [phone]   RxID:   [id number] METOCLOPRAMIDE HCL 10 MG  TABS (METOCLOPRAMIDE HCL) As per prep instructions.  #2 x 0   Entered by:   Wyona Almas RN   Authorized by:   Hart Carwin MD   Signed by:   Wyona Almas RN on 05/15/2010   Method used:   Electronically to        [facility]  [phone]* (retail)       [address]       [place], [place]  [address]       Ph: [phone] or [phone]       Fax: [phone]   RxID:   [id number] DULCOLAX 5 MG  TBEC (BISACODYL) Day before procedure take 2 at 3pm and 2 at 8pm.  #4 x 0   Entered by:   Wyona Almas RN   Authorized by:   Hart Carwin MD   Signed by:   Wyona Almas RN on 05/15/2010   Method used:   Electronically to        [facility]  [phone]* (retail)       [address] [place]  [place], [place]  [address]       Ph: [phone] or [phone]       Fax: [phone]   RxID:   [id number]

## 2010-09-02 NOTE — Miscellaneous (Signed)
Summary: Asacol, Bentyl, FeSo4 Rx  Clinical Lists Changes  Medications: Added new medication of ASACOL HD 800 MG TBEC (MESALAMINE) Take 2 tablets by mouth three times a day - Signed Added new medication of BENTYL 10 MG  CAPS (DICYCLOMINE HCL) Take 1 by mouth two times a day - Signed Added new medication of FERROUS SULFATE 325 (65 FE) MG  TABS (FERROUS SULFATE) Take 1 by mouth three times a day - Signed Rx of ASACOL HD 800 MG TBEC (MESALAMINE) Take 2 tablets by mouth three times a day;  #180 x 6;  Signed;  Entered by: Jennye Boroughs RN;  Authorized by: Hart Carwin MD;  Method used: Electronically to [facility]  [phone]*, [address], [place], [place], [place]  [address], Ph: [phone] or [phone], Fax: [phone] Rx of BENTYL 10 MG  CAPS (DICYCLOMINE HCL) Take 1 by mouth two times a day;  #60 x 1;  Signed;  Entered by: Jennye Boroughs RN;  Authorized by: Hart Carwin MD;  Method used: Electronically to [facility]  [phone]*, [address] [place], [place], [place]  [address], Ph: [phone] or [phone], Fax: [phone] Rx of FERROUS SULFATE 325 (65 FE) MG  TABS (FERROUS SULFATE) Take 1 by mouth three times a day;  #100 x 0;  Signed;  Entered by: Jennye Boroughs RN;  Authorized by: Hart Carwin MD;  Method used: Electronically to [facility]  [phone]*, [address], [place], [place], [place]  [address], Ph: [phone] or [phone], Fax: [phone]    Prescriptions: FERROUS SULFATE 325 (65 FE) MG  TABS (FERROUS SULFATE) Take 1 by mouth three times a day  #100 x 0   Entered by:   Jennye Boroughs RN   Authorized by:   Hart Carwin MD   Signed by:   Jennye Boroughs RN on 05/23/2010   Method used:   Electronically to        [facility]  [phone]* (retail)       [address]       [place], [place]  [address]       Ph: [phone] or [phone]       Fax: [phone]   RxID:   [id number] BENTYL 10 MG  CAPS (DICYCLOMINE HCL) Take 1 by mouth two times a day  #60 x 1   Entered by:   Jennye Boroughs RN   Authorized by:   Hart Carwin MD   Signed by:   Jennye Boroughs RN on 05/23/2010   Method used:   Electronically to        [facility]  [phone]* (retail)       [address]       [place], [place]  [address]       Ph: [phone] or [phone]       Fax: [phone]   RxID:   [id number] ASACOL HD 800 MG TBEC (MESALAMINE) Take 2 tablets by mouth three times a day  #180 x 6   Entered by:   Jennye Boroughs RN   Authorized by:   Hart Carwin MD   Signed by:   Jennye Boroughs RN on 05/23/2010   Method used:   Electronically to        [facility]  [phone]* (retail)       [address]  [place], [place]  [address]       Ph: [phone] or [phone]       Fax: [phone]   RxID:   [id number]

## 2010-09-02 NOTE — Progress Notes (Signed)
----   Converted from flag ---- ---- 05/26/2010 9:56 PM, Hart Carwin MD wrote: Barbara Drake, please call this lady and start her on prednisone 10mg ,  #100, take 3 by mouth q am, 1 refill, She ougt to see me within next 2-3 weeks. ------------------------------  Phone Note Outgoing Call Call back at [facility] Phone [phone]   Call placed by: Jesse Fall RN,  May 27, 2010 7:56 AM  Follow-up for Phone Call        Message left for patient to call back at her home number. No answer at work number.  Message left for patient to call back again @ 3:00PM Left message for patient to call back at her work #.  Spoke with patient. She will start her Prednisone 30 mg daily. Scheduled appointment with Dr. Juanda Chance on 06/09/10 @ 2:30 PM.  Follow-up by: Jesse Fall RN,  May 28, 2010 8:49 AM    New/Updated Medications: PREDNISONE 10 MG TABS (PREDNISONE) Take 3 tablets by mouth every AM Prescriptions: PREDNISONE 10 MG TABS (PREDNISONE) Take 3 tablets by mouth every AM  #100 x 1   Entered by:   Jesse Fall RN   Authorized by:   Hart Carwin MD   Signed by:   Jesse Fall RN on 05/28/2010   Method used:   Electronically to        [facility]  [phone]* (retail)       [address]       [place], [place]  [address]       Ph: [phone] or [phone]       Fax: [phone]   RxID:   [id number]

## 2010-09-02 NOTE — Letter (Signed)
Summary: Patient Notice- Colon Biospy Results  [facility]  [address] [place], [place] [address]   Phone: [phone]  Fax: [phone]        May 26, 2010 MRN: [id number]    Barbara Drake [address] [place], [place]  [address]    Dear Ms. Barbara Drake,  I am pleased to inform you that the biopsies taken during your recent colonoscopy did not show any evidence of cancer upon pathologic examination.The biopsies show moderately severe colitis, Crohn's colitis.  Additional information/recommendations:  __No further action is needed at this time.  Please follow-up with      your primary care physician for your other healthcare needs.  _x_Please call [phone] to schedule a return visit to review      your condition.My office should have made an appointment for You.  _x_Continue with the treatment plan as outlined on the day of your      exam.We need to add Prednisone to Your medications. Please call Barbara Drake, my nurse, to [phone]  _x_You should have a repeat colonoscopy examination for this problem           in 5_ years.  Please call us if you are having persistent problems or have questions about your condition that have not been fully answered at this time.  Sincerely,  Hart Carwin MD   This letter has been electronically signed by your physician.  Appended Document: Patient Notice- Colon Biospy Results letter mailed

## 2010-09-02 NOTE — Progress Notes (Signed)
Summary: Triage  Phone Note Call from Patient Call back at [phone]   Caller: Patient Call For: Dr. Juanda Chance Reason for Call: Talk to Nurse Summary of Call: pt. is requesting to speak w/Dr. Juanda Chance. Was told to call her on 05-02-10 Initial call taken by: Karna Christmas,  May 05, 2010 9:12 AM  Follow-up for Phone Call        Pt was calling to give a symptom  update as requested by Dr Juanda Chance during the office visit 04/25/10.  Patient  is still on Prev-pak.  She reports that her abdominal tenderness is better.  She does still c/o loose stool, cramping, and urgency with BM.  has 3 days left of the Prev-pak.   Follow-up by: Darcey Nora RN, CGRN,  May 05, 2010 10:09 AM  Additional Follow-up for Phone Call Additional follow up Details #1::        Please continue the Prev-pak to the end. And schedule outpatient colonoscopy in next 2 -3 weeks. OK to take Imodium.Stay on low residue diet Additional Follow-up by: Hart Carwin MD,  May 05, 2010 11:16 AM    Additional Follow-up for Phone Call Additional follow up Details #2::    Colon scheduled in [facility] for 05/23/10 8:30, pre-visit 05/15/10 11:00 Follow-up by: Darcey Nora RN, CGRN,  May 05, 2010 12:04 PM

## 2010-09-02 NOTE — Progress Notes (Signed)
Summary: ?PPI for Abd. pain  Phone Note Call from Patient   Caller: Patient Call For: Dr. Lina Sar Complaint: Nausea/Vomiting/Diarrhea, Abdominal Pain Summary of Call: Dr. Juanda Chance, I saw Ms. Tamashiro in previsit today and she is still having abdominal pain, N/V/D.  She said she felt better while taking the Prevpac.  Did you want her to be on a PPI?  Thank you.  Alvino Chapel Initial call taken by: Wyona Almas RN,  May 15, 2010 11:48 AM  Follow-up for Phone Call        Yes, please start on generic Prevacid ( Pantoprazole 30mg ), # 30 1 by mouth once daily. Also She ought to have an EGD at the time of colonoscopy since her symptoms improved on PPI's and she has never been evaluated for it. Follow-up by: Hart Carwin MD,  May 17, 2010 10:10 PM  Additional Follow-up for Phone Call Additional follow up Details #1::        LM for pt to call re: above message Additional Follow-up by: Wyona Almas RN,  May 20, 2010 8:09 AM    New/Updated Medications: PANTOPRAZOLE SODIUM 40 MG  TBEC (PANTOPRAZOLE SODIUM) 1 each day 30 minutes before meal Prescriptions: PANTOPRAZOLE SODIUM 40 MG  TBEC (PANTOPRAZOLE SODIUM) 1 each day 30 minutes before meal  #30 x prn   Entered by:   Wyona Almas RN   Authorized by:   Hart Carwin MD   Signed by:   Wyona Almas RN on 05/20/2010   Method used:   Electronically to        [facility]  [phone]* (retail)       [address]       [place], [place]  [address]       Ph: [phone] or [phone]       Fax: [phone]   RxID:   [id number]   Appended Document: ?PPI for Abd. pain Spoke with pt re: prep for EGD and why she'd need to rsc for double/Dr. Juanda Chance.  Appended Document: ?PPI for Abd. pain    Clinical Lists Changes  Orders: Added new Test order of Colon/Endo (Colon/Endo) - Signed

## 2010-09-02 NOTE — Letter (Signed)
Summary: [facility] Instructions  [facility]  [address] [place], [place] [address]   Phone: [phone]  Fax: [phone]       Barbara Drake    10-12-1960    MRN: [id number]       Procedure Day Dorna Bloom:  Farrell Ours  05/23/10     Arrival Time: 7:30am     Procedure Time: 8:30am     Location of Procedure:                    [facility] (4th Floor)   PREPARATION FOR COLONOSCOPY WITH MIRALAX  Starting 5 days prior to your procedure  SUNDAY 05/18/10  do not eat nuts, seeds, popcorn, corn, beans, peas,  salads, or any raw vegetables.  Do not take any fiber supplements (e.g. Metamucil, Citrucel, and Benefiber). ____________________________________________________________________________________________________   THE DAY BEFORE YOUR PROCEDURE         DATE: THURSDAY  05/22/10  1   Drink clear liquids the entire day-NO SOLID FOOD  2   Do not drink anything colored red or purple.  Avoid juices with pulp.  No orange juice.  3   Drink at least 64 oz. (8 glasses) of fluid/clear liquids during the day to prevent dehydration and help the prep work efficiently.  CLEAR LIQUIDS INCLUDE: Water Jello Ice Popsicles Tea (sugar ok, no milk/cream) Powdered fruit flavored drinks Coffee (sugar ok, no milk/cream) Gatorade Juice: apple, white grape, white cranberry  Lemonade Clear bullion, consomm, broth Carbonated beverages (any kind) Strained chicken noodle soup Hard Candy  4   Mix the entire bottle of Miralax with 64 oz. of Gatorade/Powerade in the morning and put in the refrigerator to chill.  5   At 3:00 pm take 2 Dulcolax/Bisacodyl tablets.  6   At 4:30 pm take one Reglan/Metoclopramide tablet.  7  Starting at 5:00 pm drink one 8 oz glass of the Miralax mixture every 15-20 minutes until you have finished drinking the entire 64 oz.  You should finish drinking prep around 7:30 or 8:00 pm.  8   If you are nauseated, you may take the 2nd  Reglan/Metoclopramide tablet at 6:30 pm.        9    At 8:00 pm take 2 more DULCOLAX/Bisacodyl tablets.     THE DAY OF YOUR PROCEDURE      DATE:  Farrell Ours  05/23/10  You may drink clear liquids until  6:30am   (2 HOURS BEFORE PROCEDURE).   MEDICATION INSTRUCTIONS  Unless otherwise instructed, you should take regular prescription medications with a small sip of water as early as possible the morning of your procedure.       OTHER INSTRUCTIONS  You will need a responsible adult at least 49 years of age to accompany you and drive you home.   This person must remain in the waiting room during your procedure.  Wear loose fitting clothing that is easily removed.  Leave jewelry and other valuables at home.  However, you may wish to bring a book to read or an iPod/MP3 player to listen to music as you wait for your procedure to start.  Remove all body piercing jewelry and leave at home.  Total time from sign-in until discharge is approximately 2-3 hours.  You should go home directly after your procedure and rest.  You can resume normal activities the day after your procedure.  The day of your procedure you should not:   Drive  Make legal decisions   Operate machinery   Drink alcohol   Return to work  You will receive specific instructions about eating, activities and medications before you leave.   The above instructions have been reviewed and explained to me by  Wyona Almas RN  May 15, 2010 11:36 AM     I fully understand and can verbalize these instructions _____________________________ Date _______

## 2010-09-02 NOTE — Progress Notes (Signed)
Summary: Schedule Appt  Phone Note Outgoing Call Call back at [phone]   Call placed by: Dottie Nelson-Smith CMA Duncan Dull),  April 22, 2010 4:24 PM Call placed to: Patient Summary of Call: Per Dr Juanda Chance, Dr Della Goo called to ask that we see patient for colitis. She was recently just d/c'ed from hospital and a CT scan completed there showed nonspecific colitis. I have left a message for patient to call back so we can schedule her for an appointment. Initial call taken by: Lamona Curl CMA Duncan Dull),  April 22, 2010 4:24 PM  Follow-up for Phone Call        Patient has scheduled an appointment for Friday 04/25/10.  Follow-up by: Lamona Curl CMA (AAMA),  April 23, 2010 10:14 AM

## 2010-09-02 NOTE — Consult Note (Signed)
Summary: Nausea, Vomiting, Diarrhea  NAME:  Barbara Drake, Barbara Drake           ACCOUNT NO.:  [id number]      MEDICAL RECORD NO.:  [id number]          PATIENT TYPE:  INP      LOCATION:  1318                         FACILITY:  [facility]      PHYSICIAN:  Petra Kuba, M.D.    DATE OF BIRTH:  01-Jan-1961      DATE OF CONSULTATION:  04/18/2010   DATE OF DISCHARGE:                                    CONSULTATION      THIS DOCUMENT HAS NOT BEEN REVIEWED OR ELECTRONICALLY SIGNED BY THE   DICTATOR OR ATTENDING PHYSICIAN      HISTORY:  The patient seen at request of the hospital doctors for   nausea, vomiting, diarrhea, abdominal pain, and abnormal CAT scan,   worrisome for right-sided colitis and possible ileitis.  She has had 3   episodes.  She says that have been similar over the last 5 years, one   time she stayed at home, one time she came to the emergency room in 2006   where an ultrasound was done and labs were normal at that period.  She   was not admitted.  At this time, the CAT scan was done with above   findings.  She actually is better eating solid food, decreased diarrhea   and pain and that she wants to go home.  She has had some periodic fever   and does work at a nursing home, but has not had any significant travel   or been on any other new medicines or antibiotics.      PAST MEDICAL HISTORY:  Pertinent for left nephrectomy and hypertension.      MEDICATIONS AT HOME:  Include amlodipine.      ALLERGIES:  SULFA.      SOCIAL HISTORY:  Quit smoking, does not drink.  Denies drug use.      FAMILY HISTORY:  Pertinent for one nephew with Crohn disease.      REVIEW OF SYSTEMS:  Negative except above.      PHYSICAL EXAMINATION:  GENERAL:  No acute distress.   VITAL SIGNS:  Stable, afebrile.   ABDOMEN:  Soft, nontender.      LABORATORY DATA:  Pertinent for a positive Helicobacter antibody.   Normal BUN and creatinine.  Normal CBC, although her hemoglobin has   dropped to 9.1 with an  MCV of 80.  C difficile x2 were negative.  Stool   culture negative so far.  Complete metabolic profile pertinent for an   albumin of 2.8 with normal liver tests.  Fecal lactoferrin was positive,   however, TSH normal.  Hemoglobin on admission 11.4, did drop as above   with hydration, MCV of 80 with normal white count and platelet count.   CT scan reviewed, pertinent for a mild right-sided colitis, as mentioned   above.      ASSESSMENT:  Gastroenteritis, colitis, questionable etiology, seemingly   improved.      PLAN:  Risks, benefits, methods of a colonoscopy was discussed since she   wants to go home, but also wants  to make sure if things are okay.  Happy   to proceed with that as an outpatient.  We talked about colon screening   in the   age 74s so proceeding at 5 would be okay.  I offered her the options of   waiting for the next attack and proceeding sooner than when she is   better, but she has elected to go home and call me to either followup if   her symptoms return or proceed with colonoscopy at her convenience.   Please call if any further questions or problems.                  ______________________________   Petra Kuba, M.D.            THIS DOCUMENT HAS NOT BEEN REVIEWED OR ELECTRONICALLY SIGNED BY THE   DICTATOR OR ATTENDING PHYSICIAN      MEM/MEDQ  D:  04/18/2010  T:  04/19/2010  Job:  [id number]      cc:   Della Goo, M.D.   Fax: [phone]

## 2010-10-16 LAB — BASIC METABOLIC PANEL
BUN: 5 mg/dL — ABNORMAL LOW (ref 6–23)
CO2: 26 mEq/L (ref 19–32)
CO2: 24 mEq/L (ref 19–32)
Calcium: 8.6 mg/dL (ref 8.4–10.5)
Chloride: 104 mEq/L (ref 96–112)
Creatinine, Ser: 0.84 mg/dL (ref 0.4–1.2)
GFR calc Af Amer: >60 mL/min (ref >60)
GFR calc Af Amer: >60 mL/min (ref >60)
GFR calc non Af Amer: >60 mL/min (ref >60)
Glucose, Bld: 123 mg/dL — ABNORMAL HIGH (ref 70–99)
Potassium: 3.9 mEq/L (ref 3.5–5.1)
Potassium: 3.4 mEq/L — ABNORMAL LOW (ref 3.5–5.1)
Sodium: 137 mEq/L (ref 135–145)
Sodium: 137 mEq/L (ref 135–145)

## 2010-10-16 LAB — CBC
HCT: 28.5 % — ABNORMAL LOW (ref 36.0–46.0)
HCT: 32.4 % — ABNORMAL LOW (ref 36.0–46.0)
HCT: 34 % — ABNORMAL LOW (ref 36.0–46.0)
Hemoglobin: 11.5 g/dL — ABNORMAL LOW (ref 12.0–15.0)
Hemoglobin: 9.6 g/dL — ABNORMAL LOW (ref 12.0–15.0)
Hemoglobin: 10.9 g/dL — ABNORMAL LOW (ref 12.0–15.0)
MCH: 26.9 pg (ref 26.0–34.0)
MCH: 27.1 pg (ref 26.0–34.0)
MCHC: 35.4 g/dL (ref 30.0–36.0)
MCHC: 33.5 g/dL (ref 30.0–36.0)
MCHC: 33.8 g/dL (ref 30.0–36.0)
MCV: 79.8 fL (ref 78.0–100.0)
Platelets: 233 10*3/uL (ref 150–400)
Platelets: 246 10*3/uL (ref 150–400)
RBC: 3.4 MIL/uL — ABNORMAL LOW (ref 3.87–5.11)
RBC: 3.56 MIL/uL — ABNORMAL LOW (ref 3.87–5.11)
RBC: 3.57 MIL/uL — ABNORMAL LOW (ref 3.87–5.11)
RDW: 14.1 % (ref 11.5–15.5)
RDW: 14.2 % (ref 11.5–15.5)
RDW: 14.3 % (ref 11.5–15.5)
WBC: 9.1 10*3/uL (ref 4.0–10.5)
WBC: 6.3 10*3/uL (ref 4.0–10.5)
WBC: 7.2 10*3/uL (ref 4.0–10.5)

## 2010-10-16 LAB — DIFFERENTIAL
Basophils Absolute: 0 10*3/uL (ref 0.0–0.1)
Basophils Relative: 0 % (ref 0–1)
Eosinophils Absolute: 0 10*3/uL (ref 0.0–0.7)
Eosinophils Absolute: 0.1 10*3/uL (ref 0.0–0.7)
Eosinophils Relative: 1 % (ref 0–5)
Lymphocytes Relative: 11 % — ABNORMAL LOW (ref 12–46)
Lymphocytes Relative: 16 % (ref 12–46)
Lymphs Abs: 1.3 10*3/uL (ref 0.7–4.0)
Monocytes Absolute: 1.1 10*3/uL — ABNORMAL HIGH (ref 0.1–1.0)
Monocytes Absolute: 1.4 10*3/uL — ABNORMAL HIGH (ref 0.1–1.0)
Monocytes Relative: 11 % (ref 3–12)
Monocytes Relative: 16 % — ABNORMAL HIGH (ref 3–12)
Neutro Abs: 6.4 10*3/uL (ref 1.7–7.7)
Neutrophils Relative %: 77 % (ref 43–77)
Neutrophils Relative %: 72 % (ref 43–77)
WBC Morphology: INCREASED

## 2010-10-16 LAB — CULTURE, BLOOD (ROUTINE X 2)
Culture  Setup Time: 201109140215
Culture: NO GROWTH
Culture: NO GROWTH

## 2010-10-16 LAB — LIPASE, BLOOD: Lipase: 25 U/L (ref 11–59)

## 2010-10-16 LAB — COMPREHENSIVE METABOLIC PANEL
ALT: 19 U/L (ref 0–35)
ALT: 27 U/L (ref 0–35)
Calcium: 8.3 mg/dL — ABNORMAL LOW (ref 8.4–10.5)
Calcium: 8.7 mg/dL (ref 8.4–10.5)
Chloride: 107 mEq/L (ref 96–112)
Creatinine, Ser: 0.76 mg/dL (ref 0.4–1.2)
Creatinine, Ser: 0.82 mg/dL (ref 0.4–1.2)
GFR calc Af Amer: >60 mL/min (ref >60)
GFR calc Af Amer: >60 mL/min (ref >60)
GFR calc non Af Amer: >60 mL/min (ref >60)
Glucose, Bld: 95 mg/dL (ref 70–99)
Potassium: 3 mEq/L — ABNORMAL LOW (ref 3.5–5.1)
Sodium: 135 mEq/L (ref 135–145)
Sodium: 137 mEq/L (ref 135–145)
Total Protein: 5.7 g/dL — ABNORMAL LOW (ref 6.0–8.3)
Total Protein: 7.1 g/dL (ref 6.0–8.3)

## 2010-10-16 LAB — GLUCOSE, CAPILLARY
Glucose-Capillary: 104 mg/dL — ABNORMAL HIGH (ref 70–99)
Glucose-Capillary: 106 mg/dL — ABNORMAL HIGH (ref 70–99)
Glucose-Capillary: 116 mg/dL — ABNORMAL HIGH (ref 70–99)
Glucose-Capillary: 160 mg/dL — ABNORMAL HIGH (ref 70–99)

## 2010-10-16 LAB — URINE MICROSCOPIC-ADD ON

## 2010-10-16 LAB — URINALYSIS, ROUTINE W REFLEX MICROSCOPIC
Glucose, UA: NEGATIVE mg/dL
Leukocytes, UA: NEGATIVE
Nitrite: NEGATIVE
Protein, ur: NEGATIVE mg/dL
pH: 6 (ref 5.0–8.0)

## 2010-10-16 LAB — TSH: TSH: 1.765 u[IU]/mL (ref 0.350–4.500)

## 2010-10-16 LAB — POCT I-STAT, CHEM 8
HCT: 37 % (ref 36.0–46.0)
Hemoglobin: 12.6 g/dL (ref 12.0–15.0)
Sodium: 134 mEq/L — ABNORMAL LOW (ref 135–145)
TCO2: 24 mmol/L (ref 0–100)

## 2010-10-16 LAB — URINE CULTURE
Culture: NO GROWTH
Special Requests: NEGATIVE

## 2010-10-16 LAB — STOOL CULTURE

## 2015-05-22 ENCOUNTER — Encounter: Payer: Self-pay | Admitting: Gastroenterology

## 2015-07-12 ENCOUNTER — Encounter: Payer: Self-pay | Admitting: Internal Medicine

## 2020-04-03 ENCOUNTER — Other Ambulatory Visit: Payer: Self-pay | Admitting: Family Medicine

## 2020-04-03 ENCOUNTER — Other Ambulatory Visit (HOSPITAL_COMMUNITY)
Admission: RE | Admit: 2020-04-03 | Discharge: 2020-04-03 | Disposition: A | Discharge status: Home / Self Care | Payer: BLUE CROSS/BLUE SHIELD | Source: Ambulatory Visit | Attending: Family Medicine | Admitting: Family Medicine

## 2020-04-03 DIAGNOSIS — Z01411 Encounter for gynecological examination (general) (routine) with abnormal findings: Secondary | ICD-10-CM | POA: Diagnosis not present

## 2020-04-04 LAB — CYTOLOGY - PAP
Comment: NEGATIVE
Diagnosis: NEGATIVE
High risk HPV: NEGATIVE

## 2020-05-16 ENCOUNTER — Other Ambulatory Visit: Payer: Self-pay | Admitting: Family Medicine

## 2020-05-16 DIAGNOSIS — Z1231 Encounter for screening mammogram for malignant neoplasm of breast: Secondary | ICD-10-CM

## 2020-06-14 ENCOUNTER — Other Ambulatory Visit: Payer: Self-pay

## 2020-06-14 ENCOUNTER — Ambulatory Visit
Admission: RE | Admit: 2020-06-14 | Discharge: 2020-06-14 | Disposition: A | Discharge status: Home / Self Care | Payer: BLUE CROSS/BLUE SHIELD | Source: Ambulatory Visit | Attending: Family Medicine | Admitting: Family Medicine

## 2020-06-14 DIAGNOSIS — Z1231 Encounter for screening mammogram for malignant neoplasm of breast: Secondary | ICD-10-CM

## 2020-06-19 ENCOUNTER — Other Ambulatory Visit: Payer: Self-pay | Admitting: Family Medicine

## 2020-06-19 DIAGNOSIS — R928 Other abnormal and inconclusive findings on diagnostic imaging of breast: Secondary | ICD-10-CM

## 2020-07-05 ENCOUNTER — Other Ambulatory Visit: Payer: Self-pay

## 2020-07-05 ENCOUNTER — Other Ambulatory Visit: Payer: Self-pay | Admitting: Family Medicine

## 2020-07-05 ENCOUNTER — Ambulatory Visit
Admission: RE | Admit: 2020-07-05 | Discharge: 2020-07-05 | Disposition: A | Discharge status: Home / Self Care | Payer: BLUE CROSS/BLUE SHIELD | Source: Ambulatory Visit | Attending: Family Medicine | Admitting: Family Medicine

## 2020-07-05 DIAGNOSIS — R928 Other abnormal and inconclusive findings on diagnostic imaging of breast: Secondary | ICD-10-CM

## 2020-07-05 DIAGNOSIS — R921 Mammographic calcification found on diagnostic imaging of breast: Secondary | ICD-10-CM

## 2020-07-19 ENCOUNTER — Other Ambulatory Visit: Payer: Self-pay

## 2020-07-19 ENCOUNTER — Ambulatory Visit
Admission: RE | Admit: 2020-07-19 | Discharge: 2020-07-19 | Disposition: A | Discharge status: Home / Self Care | Payer: BLUE CROSS/BLUE SHIELD | Source: Ambulatory Visit | Attending: Family Medicine | Admitting: Family Medicine

## 2020-07-19 DIAGNOSIS — R928 Other abnormal and inconclusive findings on diagnostic imaging of breast: Secondary | ICD-10-CM

## 2020-07-19 DIAGNOSIS — R921 Mammographic calcification found on diagnostic imaging of breast: Secondary | ICD-10-CM

## 2021-07-21 ENCOUNTER — Encounter (HOSPITAL_BASED_OUTPATIENT_CLINIC_OR_DEPARTMENT_OTHER): Payer: Self-pay

## 2021-07-21 ENCOUNTER — Emergency Department (HOSPITAL_BASED_OUTPATIENT_CLINIC_OR_DEPARTMENT_OTHER)
Admission: EM | Admit: 2021-07-21 | Discharge: 2021-07-21 | Disposition: A | Discharge status: Home / Self Care | Payer: BLUE CROSS/BLUE SHIELD | Attending: Emergency Medicine | Admitting: Emergency Medicine

## 2021-07-21 ENCOUNTER — Other Ambulatory Visit: Payer: Self-pay

## 2021-07-21 DIAGNOSIS — E119 Type 2 diabetes mellitus without complications: Secondary | ICD-10-CM | POA: Diagnosis not present

## 2021-07-21 DIAGNOSIS — U071 COVID-19: Secondary | ICD-10-CM | POA: Insufficient documentation

## 2021-07-21 DIAGNOSIS — Z79899 Other long term (current) drug therapy: Secondary | ICD-10-CM | POA: Diagnosis not present

## 2021-07-21 DIAGNOSIS — J020 Streptococcal pharyngitis: Secondary | ICD-10-CM | POA: Insufficient documentation

## 2021-07-21 DIAGNOSIS — I10 Essential (primary) hypertension: Secondary | ICD-10-CM | POA: Diagnosis not present

## 2021-07-21 DIAGNOSIS — R059 Cough, unspecified: Secondary | ICD-10-CM | POA: Diagnosis present

## 2021-07-21 DIAGNOSIS — Z87891 Personal history of nicotine dependence: Secondary | ICD-10-CM | POA: Diagnosis not present

## 2021-07-21 HISTORY — DX: Crohn's disease, unspecified, without complications: K50.90

## 2021-07-21 HISTORY — DX: Essential (primary) hypertension: I10

## 2021-07-21 LAB — RESP PANEL BY RT-PCR (FLU A&B, COVID) ARPGX2
Influenza A by PCR: NEGATIVE
Influenza B by PCR: NEGATIVE
SARS Coronavirus 2 by RT PCR: POSITIVE — AB

## 2021-07-21 LAB — GROUP A STREP BY PCR: Group A Strep by PCR: DETECTED — AB

## 2021-07-21 MED ORDER — PENICILLIN V POTASSIUM 500 MG PO TABS: 500.0000 mg | ORAL_TABLET | Freq: Three times a day (TID) | ORAL | 0 refills | Status: AC

## 2021-07-21 MED ORDER — MOLNUPIRAVIR EUA 200MG CAPSULE: 4.0000 | ORAL_CAPSULE | Freq: Two times a day (BID) | ORAL | 0 refills | Status: AC

## 2021-07-21 NOTE — ED Triage Notes (Signed)
Onset yesterday of generalized body aches; cough fever.

## 2021-07-21 NOTE — Discharge Instructions (Signed)
Take the antibiotics as prescribed.  Follow-up with your doctor to be rechecked if the symptoms or not improving.

## 2021-07-21 NOTE — ED Provider Notes (Signed)
[facility] EMERGENCY DEPT Provider Note   CSN: [id number] Arrival date & time: 07/21/21  [id number]     History Chief Complaint  Patient presents with   Cough    Barbara Drake is a 60 y.o. female.   Cough  Patient states she had some URI symptoms a couple weeks ago but it all resolved.  Yesterday after work she started having trouble with generalized body aches.  She is aching all over especially in her back.  She has had some mild cough and congestion.  She has a sore throat but no earache.  Overall she feels poorly.  She has felt feverish.  She denies any vomiting or diarrhea.  No abdominal pain.  No chest pain.  No dysuria.  Past Medical History:  Diagnosis Date   Crohn disease ([place])    Hypertension     Patient Active Problem List   Diagnosis Date Noted   HELICOBACTER PYLORI INFECTION 04/24/2010   DIABETES MELLITUS, TYPE II Q000111Q   METABOLIC SYNDROME X Q000111Q   OBESITY 04/24/2010   HYPERTENSION 04/24/2010   COLITIS 04/24/2010   HYPOKALEMIA, HX OF 04/24/2010    Past Surgical History:  Procedure Laterality Date   OVARY SURGERY Left 2003   removed     OB History   No obstetric history on file.     No family history on file.  Social History   Tobacco Use   Smoking status: Former    Types: Cigarettes  Substance Use Topics   Alcohol use: Yes   Drug use: Never    Home Medications Prior to Admission medications   Medication Sig Start Date End Date Taking? Authorizing Provider  mesalamine (LIALDA) 1.2 g EC tablet Take 2 tablets by mouth 2 (two) times daily. 11/10/18  Yes [provider]  molnupiravir EUA (LAGEVRIO) 200 mg CAPS capsule Take 4 capsules (800 mg total) by mouth 2 (two) times daily for 5 days. 07/21/21 07/26/21 Yes Dorie Rank, MD  penicillin v potassium (VEETID) 500 MG tablet Take 1 tablet (500 mg total) by mouth 3 (three) times daily for 7 days. 07/21/21 07/28/21 Yes Dorie Rank, MD  amLODipine (NORVASC) 10 MG  tablet Take 10 mg by mouth daily. 03/25/21   [provider]    Allergies    Sulfonamide derivatives  Review of Systems   Review of Systems  Respiratory:  Positive for cough.   All other systems reviewed and are negative.  Physical Exam Updated Vital Signs BP 119/65 (BP Location: Right Arm)    Pulse 84    Temp 98.9 F (37.2 C) (Oral)    Resp 18    Ht 1.575 m (5\' 2" )    Wt 72.1 kg    SpO2 97%    BMI 29.08 kg/m   Physical Exam Vitals and nursing note reviewed.  Constitutional:      General: She is not in acute distress.    Appearance: She is well-developed.  HENT:     Head: Normocephalic and atraumatic.     Right Ear: External ear normal.     Left Ear: External ear normal.     Mouth/Throat:     Pharynx: Posterior oropharyngeal erythema present.  Eyes:     General: No scleral icterus.       Right eye: No discharge.        Left eye: No discharge.     Conjunctiva/sclera: Conjunctivae normal.  Neck:     Trachea: No tracheal deviation.  Cardiovascular:  Rate and Rhythm: Normal rate and regular rhythm.  Pulmonary:     Effort: Pulmonary effort is normal. No respiratory distress.     Breath sounds: Normal breath sounds. No stridor. No wheezing or rales.  Abdominal:     General: Bowel sounds are normal. There is no distension.     Palpations: Abdomen is soft.     Tenderness: There is no abdominal tenderness. There is no guarding or rebound.  Musculoskeletal:        General: No tenderness or deformity.     Cervical back: Neck supple.  Lymphadenopathy:     Cervical: No cervical adenopathy.  Skin:    General: Skin is warm and dry.     Findings: No rash.  Neurological:     General: No focal deficit present.     Mental Status: She is alert.     Cranial Nerves: No cranial nerve deficit (no facial droop, extraocular movements intact, no slurred speech).     Sensory: No sensory deficit.     Motor: No abnormal muscle tone or seizure activity.     Coordination:  Coordination normal.  Psychiatric:        Mood and Affect: Mood normal.    ED Results / Procedures / Treatments   Labs (all labs ordered are listed, but only abnormal results are displayed) Labs Reviewed  RESP PANEL BY RT-PCR (FLU A&B, COVID) ARPGX2 - Abnormal; Notable for the following components:      Result Value   SARS Coronavirus 2 by RT PCR POSITIVE (*)    All other components within normal limits  GROUP A STREP BY PCR - Abnormal; Notable for the following components:   Group A Strep by PCR DETECTED (*)    All other components within normal limits    EKG None  Radiology No results found.  Procedures Procedures   Medications Ordered in ED Medications - No data to display  ED Course  I have reviewed the triage vital signs and the nursing notes.  Pertinent labs & imaging results that were available during my care of the patient were reviewed by me and considered in my medical decision making (see chart for details).  Clinical Course as of 07/21/21 1108  Mon Jul 21, 2021  1104 Patient's laboratory tests are positive for COVID and strep. [JK]    Clinical Course User Index [JK] Dorie Rank, MD   MDM Rules/Calculators/A&P                         Patient presented to the ED for evaluation of URI type symptoms we will sore throat.  Vital signs were stable.  Oxygenating normally.  No hypoxia noted.  1 transient O2 sat of 89% noted but I do not think that is accurate.  Patient's strep test is positive's as is her covid.  No indications for hospitalization at this time.  We will give her prescription for molnupiravir and penicillin.    Final Clinical Impression(s) / ED Diagnoses Final diagnoses:  Strep throat  COVID    Rx / DC Orders ED Discharge Orders          Ordered    penicillin v potassium (VEETID) 500 MG tablet  3 times daily        07/21/21 1106    molnupiravir EUA (LAGEVRIO) 200 mg CAPS capsule  2 times daily        07/21/21 1106  Linwood Dibbles, MD 07/21/21 [phone]

## 2022-09-18 ENCOUNTER — Other Ambulatory Visit: Payer: Self-pay

## 2022-09-18 ENCOUNTER — Emergency Department (HOSPITAL_BASED_OUTPATIENT_CLINIC_OR_DEPARTMENT_OTHER)
Admission: EM | Admit: 2022-09-18 | Discharge: 2022-09-18 | Disposition: A | Discharge status: Home / Self Care | Payer: Managed Care, Other (non HMO) | Attending: Emergency Medicine | Admitting: Emergency Medicine

## 2022-09-18 ENCOUNTER — Emergency Department (HOSPITAL_BASED_OUTPATIENT_CLINIC_OR_DEPARTMENT_OTHER): Payer: Managed Care, Other (non HMO) | Admitting: Radiology

## 2022-09-18 ENCOUNTER — Encounter (HOSPITAL_BASED_OUTPATIENT_CLINIC_OR_DEPARTMENT_OTHER): Payer: Self-pay

## 2022-09-18 DIAGNOSIS — R059 Cough, unspecified: Secondary | ICD-10-CM | POA: Diagnosis not present

## 2022-09-18 DIAGNOSIS — Z8616 Personal history of COVID-19: Secondary | ICD-10-CM | POA: Diagnosis not present

## 2022-09-18 DIAGNOSIS — Z79899 Other long term (current) drug therapy: Secondary | ICD-10-CM | POA: Diagnosis not present

## 2022-09-18 DIAGNOSIS — M791 Myalgia, unspecified site: Secondary | ICD-10-CM | POA: Diagnosis not present

## 2022-09-18 DIAGNOSIS — I10 Essential (primary) hypertension: Secondary | ICD-10-CM | POA: Insufficient documentation

## 2022-09-18 DIAGNOSIS — J020 Streptococcal pharyngitis: Secondary | ICD-10-CM | POA: Diagnosis not present

## 2022-09-18 DIAGNOSIS — J029 Acute pharyngitis, unspecified: Secondary | ICD-10-CM | POA: Diagnosis present

## 2022-09-18 DIAGNOSIS — Z1152 Encounter for screening for COVID-19: Secondary | ICD-10-CM | POA: Diagnosis not present

## 2022-09-18 LAB — RESP PANEL BY RT-PCR (RSV, FLU A&B, COVID)  RVPGX2
Influenza A by PCR: NEGATIVE
Influenza B by PCR: NEGATIVE
Resp Syncytial Virus by PCR: NEGATIVE
SARS Coronavirus 2 by RT PCR: NEGATIVE

## 2022-09-18 LAB — GROUP A STREP BY PCR: Group A Strep by PCR: DETECTED — AB

## 2022-09-18 MED ORDER — PENICILLIN G BENZATHINE 1200000 UNIT/2ML IM SUSY
1.2000 10*6.[IU] | PREFILLED_SYRINGE | Freq: Once | INTRAMUSCULAR | Status: AC
Start: 2022-09-18 — End: 2022-09-18
  Administered 2022-09-18: 1.2 10*6.[IU] via INTRAMUSCULAR
  Filled 2022-09-18: qty 2

## 2022-09-18 MED ORDER — DEXAMETHASONE 4 MG PO TABS
10.0000 mg | ORAL_TABLET | Freq: Once | ORAL | Status: AC
  Administered 2022-09-18: 10 mg via ORAL
  Filled 2022-09-18: qty 3

## 2022-09-18 NOTE — Discharge Instructions (Signed)
You were seen in the ER for evaluation of your sore throat.  You tested positive for strep.  You have chosen to do the Bicillin injections which is a one-time antibiotic dosing for this diagnosis of strep.  Have also given you a steroid here to help with inflammation.  I want you to try Tylenol ibuprofen at home for pain relief.  Please make sure you are keeping your throat well lubricated with cough drops, lozenges, water.  If you have any concerns, new or worsening symptoms, please return to the nearest emergency department for evaluation.  Contact a health care provider if: You have swelling in your neck that keeps getting bigger. You develop a rash, cough, or earache. You cough up a thick mucus that is green, yellow-brown, or bloody. You have pain or discomfort that does not get better with medicine. Your symptoms seem to be getting worse. You have a fever. Get help right away if: You have new symptoms, such as vomiting, severe headache, stiff or painful neck, chest pain, or shortness of breath. You have severe throat pain, drooling, or changes in your voice. You have swelling of the neck, or the skin on the neck becomes red and tender. You have signs of dehydration, such as tiredness (fatigue), dry mouth, and decreased urination. You become increasingly sleepy, or you cannot wake up completely. Your joints become red or painful. These symptoms may represent a serious problem that is an emergency. Do not wait to see if the symptoms will go away. Get medical help right away. Call your local emergency services (911 in the [place] Do not drive yourself to the hospital.

## 2022-09-18 NOTE — ED Triage Notes (Signed)
Onset on Wednesday night of sore throat.  Says have swollen glands in neck.  Feeling achy and chills.  Cough non productive

## 2022-09-18 NOTE — ED Notes (Signed)
Reviewed AVS/discharge instruction with patient. Time allotted for and all questions answered. Patient is agreeable for d/c and escorted to ed exit by staff.  

## 2022-09-18 NOTE — ED Provider Notes (Signed)
  [facility] Provider Note   CSN: [id number] Arrival date & time: 09/18/22  1715     History {Add pertinent medical, surgical, social history, OB history to HPI:1} Chief Complaint  Patient presents with   Sore Throat    Barbara Drake is a 62 y.o. female.   Sore Throat       Home Medications Prior to Admission medications   Medication Sig Start Date End Date Taking? Authorizing Provider  amLODipine (NORVASC) 10 MG tablet Take 10 mg by mouth daily. 03/25/21   [provider]  mesalamine (LIALDA) 1.2 g EC tablet Take 2 tablets by mouth 2 (two) times daily. 11/10/18   [provider]      Allergies    Sulfonamide derivatives    Review of Systems   Review of Systems  Physical Exam Updated Vital Signs BP 136/83 (BP Location: Right Arm)   Pulse 74   Temp 98.1 F (36.7 C) (Oral)   Resp 18   Ht 5' 2"$  (1.575 m)   Wt 76.2 kg   SpO2 98%   BMI 30.73 kg/m  Physical Exam  ED Results / Procedures / Treatments   Labs (all labs ordered are listed, but only abnormal results are displayed) Labs Reviewed  GROUP A STREP BY PCR - Abnormal; Notable for the following components:      Result Value   Group A Strep by PCR DETECTED (*)    All other components within normal limits  RESP PANEL BY RT-PCR (RSV, FLU A&B, COVID)  RVPGX2    EKG None  Radiology DG Chest 2 View  Result Date: 09/18/2022 CLINICAL DATA:  Cough and sore throat for several days EXAM: CHEST - 2 VIEW COMPARISON:  06/25/2009 FINDINGS: The heart size and mediastinal contours are within normal limits. Both lungs are clear. The visualized skeletal structures are unremarkable. IMPRESSION: No active cardiopulmonary disease. Electronically Signed   By: Inez Catalina M.D.   On: 09/18/2022 17:50    Procedures Procedures  {Document cardiac monitor, telemetry assessment procedure when appropriate:1}  Medications Ordered in ED Medications - No data to  display  ED Course/ Medical Decision Making/ A&P   {   Click here for ABCD2, HEART and other calculatorsREFRESH Note before signing :1}                          Medical Decision Making Amount and/or Complexity of Data Reviewed Radiology: ordered.   ***  {Document critical care time when appropriate:1} {Document review of labs and clinical decision tools ie heart score, Chads2Vasc2 etc:1}  {Document your independent review of radiology images, and any outside records:1} {Document your discussion with family members, caretakers, and with consultants:1} {Document social determinants of health affecting pt's care:1} {Document your decision making why or why not admission, treatments were needed:1} Final Clinical Impression(s) / ED Diagnoses Final diagnoses:  None    Rx / DC Orders ED Discharge Orders     None

## 2024-01-02 ENCOUNTER — Other Ambulatory Visit: Payer: Self-pay

## 2024-01-02 ENCOUNTER — Encounter (HOSPITAL_BASED_OUTPATIENT_CLINIC_OR_DEPARTMENT_OTHER): Payer: Self-pay | Admitting: Emergency Medicine

## 2024-01-02 ENCOUNTER — Emergency Department (HOSPITAL_BASED_OUTPATIENT_CLINIC_OR_DEPARTMENT_OTHER)
Admission: EM | Admit: 2024-01-02 | Discharge: 2024-01-02 | Disposition: A | Discharge status: Home / Self Care | Attending: Emergency Medicine | Admitting: Emergency Medicine

## 2024-01-02 DIAGNOSIS — J020 Streptococcal pharyngitis: Secondary | ICD-10-CM | POA: Insufficient documentation

## 2024-01-02 DIAGNOSIS — J029 Acute pharyngitis, unspecified: Secondary | ICD-10-CM | POA: Diagnosis present

## 2024-01-02 LAB — GROUP A STREP BY PCR: Group A Strep by PCR: DETECTED — AB

## 2024-01-02 MED ORDER — ACETAMINOPHEN 325 MG PO TABS
650.0000 mg | ORAL_TABLET | Freq: Once | ORAL | Status: AC | PRN
  Administered 2024-01-02: 650 mg via ORAL
  Filled 2024-01-02: qty 2

## 2024-01-02 MED ORDER — DEXAMETHASONE SODIUM PHOSPHATE 10 MG/ML IJ SOLN
10.0000 mg | Freq: Once | INTRAMUSCULAR | Status: AC
  Administered 2024-01-02: 10 mg via INTRAMUSCULAR
  Filled 2024-01-02: qty 1

## 2024-01-02 MED ORDER — LIDOCAINE VISCOUS HCL 2 % MT SOLN: 15.0000 mL | OROMUCOSAL | 0 refills | Status: AC | PRN

## 2024-01-02 MED ORDER — PENICILLIN G BENZATHINE 1200000 UNIT/2ML IM SUSY
1.2000 10*6.[IU] | PREFILLED_SYRINGE | Freq: Once | INTRAMUSCULAR | Status: AC
  Administered 2024-01-02: 1.2 10*6.[IU] via INTRAMUSCULAR
  Filled 2024-01-02: qty 2

## 2024-01-02 MED ORDER — LIDOCAINE VISCOUS HCL 2 % MT SOLN
15.0000 mL | Freq: Once | OROMUCOSAL | Status: AC
  Administered 2024-01-02: 15 mL via OROMUCOSAL
  Filled 2024-01-02: qty 15

## 2024-01-02 NOTE — Discharge Instructions (Addendum)
 Symptom relief  You can use acetaminophen (Tylenol) or ibuprofen (Advil, Motrin) to help with pain and fever.  Drink plenty of fluids and rest.  Throat lozenges or gargling with warm salt water may help soothe your throat.  Do not use aspirin in children.  Steroid medicines are not recommended for routine use. Preventing the spread  Strep throat can spread to others.  Stay home from work, school, or daycare until you have been on antibiotics for at least 24 hours and do not have a fever.  Wash your hands often and do not share eating or drinking utensils. What to expect  Most people start to feel better within 24 to 48 hours after starting antibiotics.  If you do not feel better after 2 days, or if you get worse, contact your doctor.  Contact a doctor if: You have swelling in your neck that keeps getting bigger. You get a rash, cough, or earache. You cough up a thick fluid that is green, yellow-brown, or bloody. You have pain that does not get better with medicine. Your symptoms get worse instead of getting better. You have a fever. Get help right away if: You vomit. You have a very bad headache. Your neck hurts or feels stiff. You have chest pain or are short of breath. You have drooling, very bad throat pain, or changes in your voice. Your neck is swollen, or the skin gets red and tender. Your mouth is dry, or you are peeing less than normal. You keep feeling more tired or have trouble waking up. Your joints are red or painful. These symptoms may be an emergency. Do not wait to see if the symptoms will go away. Get help right away. Call your local emergency services (911 in the [place]

## 2024-01-02 NOTE — ED Notes (Signed)
 Tolerated PO well. Denies difficulty swallowing.Aaron Aas

## 2024-01-02 NOTE — ED Notes (Signed)
 DC instructions given. Pt verbalized understanding. Pt out of ED with all belongings and paperwork, not in visible distress.

## 2024-01-02 NOTE — ED Provider Notes (Signed)
  [facility] EMERGENCY DEPARTMENT AT [facility] Provider Note   CSN: [id number] Arrival date & time: 01/02/24  1835     History {Add pertinent medical, surgical, social history, OB history to HPI:1} Chief Complaint  Patient presents with  . Sore Throat    Barbara Drake is a 63 y.o. female.   Sore Throat      Home Medications Prior to Admission medications   Medication Sig Start Date End Date Taking? Authorizing Provider  amLODipine (NORVASC) 10 MG tablet Take 10 mg by mouth daily. 03/25/21   [provider]  mesalamine (LIALDA) 1.2 g EC tablet Take 2 tablets by mouth 2 (two) times daily. 11/10/18   [provider]      Allergies    Sulfonamide derivatives    Review of Systems   Review of Systems  Physical Exam Updated Vital Signs BP 120/78 (BP Location: Right Arm)   Pulse 81   Temp (!) 101 F (38.3 C)   Resp 18   SpO2 97%  Physical Exam  ED Results / Procedures / Treatments   Labs (all labs ordered are listed, but only abnormal results are displayed) Labs Reviewed  GROUP A STREP BY PCR - Abnormal; Notable for the following components:      Result Value   Group A Strep by PCR DETECTED (*)    All other components within normal limits    EKG None  Radiology No results found.  Procedures Procedures  {Document cardiac monitor, telemetry assessment procedure when appropriate:1}  Medications Ordered in ED Medications  acetaminophen (TYLENOL) tablet 650 mg (650 mg Oral Given 01/02/24 1853)    ED Course/ Medical Decision Making/ A&P   {   Click here for ABCD2, HEART and other calculatorsREFRESH Note before signing :1}                              Medical Decision Making Risk OTC drugs.   ***  {Document critical care time when appropriate:1} {Document review of labs and clinical decision tools ie heart score, Chads2Vasc2 etc:1}  {Document your independent review of radiology images, and any outside  records:1} {Document your discussion with family members, caretakers, and with consultants:1} {Document social determinants of health affecting pt's care:1} {Document your decision making why or why not admission, treatments were needed:1} Final Clinical Impression(s) / ED Diagnoses Final diagnoses:  None    Rx / DC Orders ED Discharge Orders     None

## 2024-01-02 NOTE — ED Triage Notes (Signed)
 Sore throat/ swelling tonsils  Started Friday.  Cough, fever
